# Patient Record
Sex: Male | Born: 1980 | Race: Black or African American | Hispanic: No | Marital: Married | Smoking: Never smoker
Health system: Southern US, Community
[De-identification: ages and names within clinical notes are randomized; demographics above are authoritative.]

---

## 2011-07-23 ENCOUNTER — Encounter: Payer: Self-pay | Admitting: Internal Medicine

## 2011-07-23 ENCOUNTER — Other Ambulatory Visit (INDEPENDENT_AMBULATORY_CARE_PROVIDER_SITE_OTHER): Payer: BC Managed Care – PPO

## 2011-07-23 ENCOUNTER — Ambulatory Visit (INDEPENDENT_AMBULATORY_CARE_PROVIDER_SITE_OTHER): Payer: BC Managed Care – PPO | Admitting: Internal Medicine

## 2011-07-23 VITALS — BP 118/70 | HR 75 | Temp 98.8°F | Resp 16 | Wt 185.0 lb

## 2011-07-23 DIAGNOSIS — Z0000 Encounter for general adult medical examination without abnormal findings: Secondary | ICD-10-CM

## 2011-07-23 DIAGNOSIS — Z23 Encounter for immunization: Secondary | ICD-10-CM

## 2011-07-23 LAB — LIPID PANEL
Cholesterol: 152 mg/dL (ref 0–200)
LDL Cholesterol: 95 mg/dL (ref 0–99)
Total CHOL/HDL Ratio: 3
Triglycerides: 25 mg/dL (ref 0.0–149.0)
VLDL: 5 mg/dL (ref 0.0–40.0)

## 2011-07-23 LAB — COMPREHENSIVE METABOLIC PANEL
ALT: 18 U/L (ref 0–53)
AST: 20 U/L (ref 0–37)
Albumin: 4.3 g/dL (ref 3.5–5.2)
CO2: 31 mEq/L (ref 19–32)
Calcium: 9 mg/dL (ref 8.4–10.5)
Chloride: 103 mEq/L (ref 96–112)
Potassium: 3.9 mEq/L (ref 3.5–5.1)
Sodium: 141 mEq/L (ref 135–145)
Total Protein: 7.6 g/dL (ref 6.0–8.3)

## 2011-07-23 LAB — CBC WITH DIFFERENTIAL/PLATELET
Basophils Absolute: 0 10*3/uL (ref 0.0–0.1)
Eosinophils Absolute: 0.1 10*3/uL (ref 0.0–0.7)
Lymphocytes Relative: 36.9 % (ref 12.0–46.0)
MCHC: 32.5 g/dL (ref 30.0–36.0)
Monocytes Relative: 7.9 % (ref 3.0–12.0)
Neutrophils Relative %: 53 % (ref 43.0–77.0)
RDW: 13.3 % (ref 11.5–14.6)

## 2011-07-23 LAB — URINALYSIS, ROUTINE W REFLEX MICROSCOPIC
Hgb urine dipstick: NEGATIVE
Ketones, ur: NEGATIVE
Total Protein, Urine: NEGATIVE
Urine Glucose: NEGATIVE

## 2011-07-23 NOTE — Patient Instructions (Signed)
Health Maintenance in Males MAINTAIN REGULAR HEALTH EXAMS  Maintain a healthy diet and normal weight. Increased weight leads to problems with blood pressure and diabetes. Decrease fat in the diet and increase exercise. Obtain a proper diet from your caregiver if necessary.   High blood pressure causes heart and blood vessel problems. Check blood pressures regularly and keep your blood pressure at normal limits. Aerobic exercise helps this. Persistent elevations of blood pressure should be treated with medications if weight loss and exercise are ineffective.   Avoid smoking, drinking in excess (more than 2 drinks per day), or use of street drugs. Do not share needles with anyone. Ask for help if you need assistance or instructions on stopping the use of alcohol, cigarettes, or drugs.   Maintain normal blood lipids and cholesterol. Your caregiver can give you information to lower your risk of heart disease or stroke.   Ask your caregiver if you are in need of early heart disease screening because of a strong family history of heart disease or signs of elevated testosterone (male sex hormone) levels. These can predispose you to early heart disease.   Practice safe sex. Practicing safe sex decreases your risk for a sexually transmitted infection (STI). Some of the STIs are gonorrhea, chlamydia, syphilis, trichimonas, herpes, human papillomavirus (HPV), and human immunodeficiency virus (HIV). Herpes, HIV, and HPV are viral illnesses that have no cure. These can result in disability, cancer, and death.   It is not safe for someone who has AIDS or is HIV positive to have unprotected sex with a partner who is HIV positive. The reason for this is the fact that there are many different strains of HIV. If you have a strain that is readily treated with medications and then suddenly introduce a strain from a partner that has no further treatment options, you may suddenly have a strain of HIV that is untreatable.  Even if you are both positive for HIV, it is still necessary to practice safe sex.   Use sunscreen with a SPF of 15 or greater. Being outside in the sun when your shadow caused by the sun is shorter than you are, means you are being exposed to sun at greater intensity. Lighter skinned people are at a greater risk of skin cancer.   Keep carbon monoxide and smoke detectors in your home and functioning at all times. Change the batteries every 6 months.   Do monthly examinations of your testicles. The best time to do this is after a hot shower or bath when the tissues are loose. Notify your caregivers of any lumps, tenderness, or changes in size or shape.   Notify your caregiver of new moles or changes in moles, especially if there is a change in shape or color. Also notify your caregiver if a mole is larger than the size of a pencil eraser.   Stay current with your tetanus shots and other required immunizations.  The Body Mass Index (BMI) is a way of measuring how much of your body is fat. Having a BMI above 27 increases the risk of heart disease, diabetes, hypertension, stroke, and other problems related to obesity. Document Released: 04/11/2008 Document Re-Released: 04/03/2010 [employer] Patient Information 2011 [employer]. 

## 2011-07-24 ENCOUNTER — Encounter: Payer: Self-pay | Admitting: Internal Medicine

## 2011-07-24 DIAGNOSIS — Z0000 Encounter for general adult medical examination without abnormal findings: Secondary | ICD-10-CM | POA: Insufficient documentation

## 2011-07-24 DIAGNOSIS — Z23 Encounter for immunization: Secondary | ICD-10-CM | POA: Insufficient documentation

## 2011-07-24 NOTE — Progress Notes (Signed)
  Subjective:    Patient ID: Stephen Nichols, male    DOB: 1981-05-15, 30 y.o.   MRN: [id number]  HPI New to me for a complete physical, he feels well and offers no complaints.   Review of Systems  Constitutional: Negative.   HENT: Negative.   Eyes: Negative.   Respiratory: Negative.   Cardiovascular: Negative.   Gastrointestinal: Negative.   Genitourinary: Negative.   Musculoskeletal: Negative.   Skin: Negative.   Neurological: Negative.   Hematological: Negative.   Psychiatric/Behavioral: Negative.        Objective:   Physical Exam  Vitals reviewed. Constitutional: He is oriented to person, place, and time. He appears well-developed and well-nourished. No distress.  HENT:  Mouth/Throat: Oropharynx is clear and moist. No oropharyngeal exudate.  Eyes: Conjunctivae are normal. Right eye exhibits no discharge. Left eye exhibits no discharge. No scleral icterus.  Neck: Normal range of motion. Neck supple. No JVD present. No tracheal deviation present. No thyromegaly present.  Cardiovascular: Normal rate, regular rhythm, normal heart sounds and intact distal pulses.  Exam reveals no gallop and no friction rub.   No murmur heard. Pulmonary/Chest: Effort normal and breath sounds normal. No stridor. No respiratory distress. He has no wheezes. He has no rales. He exhibits no tenderness.  Abdominal: Soft. Bowel sounds are normal. He exhibits no distension and no mass. There is no tenderness. There is no rebound and no guarding. Hernia confirmed negative in the right inguinal area and confirmed negative in the left inguinal area.  Genitourinary: Testes normal and penis normal. Right testis shows no mass, no swelling and no tenderness. Right testis is descended. Left testis shows no mass, no swelling and no tenderness. Left testis is descended. Circumcised. No penile erythema or penile tenderness. No discharge found.  Musculoskeletal: Normal range of motion. He exhibits no edema and no  tenderness.  Lymphadenopathy:    He has no cervical adenopathy.       Right: No inguinal adenopathy present.       Left: No inguinal adenopathy present.  Neurological: He is alert and oriented to person, place, and time. He has normal reflexes. He displays normal reflexes. No cranial nerve deficit. He exhibits normal muscle tone. Coordination normal.  Skin: Skin is warm and dry. No rash noted. He is not diaphoretic. No erythema. No pallor.  Psychiatric: He has a normal mood and affect. His behavior is normal. Judgment and thought content normal.          Assessment & Plan:

## 2011-07-24 NOTE — Assessment & Plan Note (Signed)
Exam done, labs ordered, pt ed material was given 

## 2012-10-13 ENCOUNTER — Ambulatory Visit (INDEPENDENT_AMBULATORY_CARE_PROVIDER_SITE_OTHER): Payer: BC Managed Care – PPO | Admitting: Internal Medicine

## 2012-10-13 ENCOUNTER — Encounter: Payer: Self-pay | Admitting: Internal Medicine

## 2012-10-13 VITALS — BP 112/80 | HR 56 | Temp 98.2°F | Ht 69.0 in | Wt 179.1 lb

## 2012-10-13 DIAGNOSIS — B0089 Other herpesviral infection: Secondary | ICD-10-CM

## 2012-10-13 MED ORDER — TRIAMCINOLONE ACETONIDE 0.1 % EX OINT: TOPICAL_OINTMENT | Freq: Two times a day (BID) | CUTANEOUS | Status: DC | PRN

## 2012-10-13 MED ORDER — VALACYCLOVIR HCL 1 G PO TABS: 1000.0000 mg | ORAL_TABLET | Freq: Three times a day (TID) | ORAL | Status: DC

## 2012-10-13 NOTE — Progress Notes (Signed)
  Subjective:    Patient ID: Stephen Nichols, male    DOB: 01-Jan-1981, 31 y.o.   MRN: [id number]  HPI  complains of skin issues, recurrent Vesicle cluster - currently on R posterior neck  Review of Systems  Constitutional: Negative for fever and fatigue.  Hematological: Negative for adenopathy. Does not bruise/bleed easily.       Objective:   Physical Exam BP 112/80  Pulse 56  Temp 98.2 F (36.8 C) (Oral)  Ht 5\' 9"  (1.753 m)  Wt 179 lb 1.9 oz (81.248 kg)  BMI 26.45 kg/m2  SpO2 96% Wt Readings from Last 3 Encounters:  10/13/12 179 lb 1.9 oz (81.248 kg)  07/23/11 185 lb (83.915 kg)   Constitutional:  He appears well-developed and well-nourished. No distress. nontoxic Neck: Normal range of motion. Neck supple. No JVD present. No thyromegaly present.  Cardiovascular: Normal rate, regular rhythm and normal heart sounds.  No murmur heard. no BLE edema Pulmonary/Chest: Effort normal and breath sounds normal. No respiratory distress. no wheezes.  Neurological: he is alert and oriented to person, place, and time. No cranial nerve deficit. Coordination normal.  Skin: herpetic cluster of vesicles on r posterior neck - no cellulitis - no confluent erythema - no ulceration or induration - remaining skin unaffected.  Psychiatric: he has a normal mood and affect. behavior is normal. Judgment and thought content normal.   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

## 2012-10-13 NOTE — Patient Instructions (Signed)
It was good to see you today. Valtrex x 1 week and steroid cream as needed for itch -  Your prescription(s) have been submitted to your pharmacy. Please take as directed and contact our office if you believe you are having problem(s) with the medication(s). Call if recurrent problems for biopsy here with Dr Yetta Barre or refer to dermatology as needed

## 2012-10-27 ENCOUNTER — Other Ambulatory Visit: Payer: Self-pay | Admitting: Internal Medicine

## 2012-11-16 ENCOUNTER — Encounter: Payer: Self-pay | Admitting: Internal Medicine

## 2012-11-16 ENCOUNTER — Ambulatory Visit (INDEPENDENT_AMBULATORY_CARE_PROVIDER_SITE_OTHER): Payer: BC Managed Care – PPO | Admitting: Internal Medicine

## 2012-11-16 VITALS — BP 118/66 | HR 64 | Temp 98.1°F | Resp 16 | Wt 182.2 lb

## 2012-11-16 DIAGNOSIS — D489 Neoplasm of uncertain behavior, unspecified: Secondary | ICD-10-CM

## 2012-11-16 DIAGNOSIS — Z23 Encounter for immunization: Secondary | ICD-10-CM

## 2012-11-16 DIAGNOSIS — L309 Dermatitis, unspecified: Secondary | ICD-10-CM

## 2012-11-16 DIAGNOSIS — L259 Unspecified contact dermatitis, unspecified cause: Secondary | ICD-10-CM

## 2012-11-16 MED ORDER — METHYLPREDNISOLONE ACETATE 80 MG/ML IJ SUSP
120.0000 mg | Freq: Once | INTRAMUSCULAR | Status: AC
  Administered 2012-11-16: 120 mg via INTRAMUSCULAR

## 2012-11-16 NOTE — Patient Instructions (Signed)

## 2012-11-16 NOTE — Progress Notes (Signed)
  Subjective:    Patient ID: Stephen Nichols, male    DOB: 10-15-1981, 32 y.o.   MRN: [id number]  Rash This is a chronic problem. The current episode started more than 1 year ago. The problem has been gradually worsening since onset. The affected locations include the neck. The rash is characterized by dryness and itchiness. He was exposed to nothing. Pertinent negatives include no anorexia, congestion, cough, diarrhea, eye pain, facial edema, fatigue, fever, joint pain, nail changes, rhinorrhea, shortness of breath, sore throat or vomiting. Treatments tried: zovirax and TAC cream. The treatment provided no relief. His past medical history is significant for allergies and eczema.      Review of Systems  Constitutional: Negative.  Negative for fever and fatigue.  HENT: Negative.  Negative for congestion, sore throat and rhinorrhea.   Eyes: Negative.  Negative for pain.  Respiratory: Negative.  Negative for cough and shortness of breath.   Cardiovascular: Negative.   Gastrointestinal: Negative.  Negative for vomiting, diarrhea and anorexia.  Genitourinary: Negative.   Musculoskeletal: Negative.  Negative for joint pain.  Skin: Positive for rash. Negative for nail changes.  Neurological: Negative.   Hematological: Negative.  Negative for adenopathy. Does not bruise/bleed easily.  Psychiatric/Behavioral: Negative.        Objective:   Physical Exam  Skin:          Over the posterior aspect of his neck there are 4 macules with hyperpigmentation, dense scale, xerosis, lichenification.  The largest one is biopsied.  The area was cleaned with betadine then prepped and draped in sterile fashion. Local anesthesia was obtained with 2% lido with epi. A 4 mm punch incision was made and the specimen was collected and sent. Then a suture was placed to close the would - 1 simple interrupted suture using 6-0 nylon. The closure effect was good and he tolerated it well. Triple antibiotic ointment and a  dressing were applied.      Lab Results  Component Value Date   WBC 7.4 07/23/2011   HGB 15.3 07/23/2011   HCT 47.1 07/23/2011   PLT 179.0 07/23/2011   GLUCOSE 79 07/23/2011   CHOL 152 07/23/2011   TRIG 25.0 07/23/2011   HDL 52.10 07/23/2011   LDLCALC 95 07/23/2011   ALT 18 07/23/2011   AST 20 07/23/2011   NA 141 07/23/2011   K 3.9 07/23/2011   CL 103 07/23/2011   CREATININE 1.1 07/23/2011   BUN 12 07/23/2011   CO2 31 07/23/2011   TSH 1.01 07/23/2011      Assessment & Plan:

## 2012-11-16 NOTE — Assessment & Plan Note (Signed)
Biopsy sent

## 2012-11-16 NOTE — Assessment & Plan Note (Signed)
I will check a biopsy specimen to help diagnose his condition He was given an injection of depo-medrol IM to help with symptom relief He will continue using TAC cream to the AA

## 2012-11-24 ENCOUNTER — Encounter: Payer: Self-pay | Admitting: Internal Medicine

## 2012-11-24 ENCOUNTER — Ambulatory Visit (INDEPENDENT_AMBULATORY_CARE_PROVIDER_SITE_OTHER): Payer: BC Managed Care – PPO | Admitting: Internal Medicine

## 2012-11-24 VITALS — BP 108/68 | HR 63 | Temp 98.4°F | Resp 16 | Wt 180.0 lb

## 2012-11-24 DIAGNOSIS — L259 Unspecified contact dermatitis, unspecified cause: Secondary | ICD-10-CM

## 2012-11-24 DIAGNOSIS — L309 Dermatitis, unspecified: Secondary | ICD-10-CM

## 2012-11-24 NOTE — Assessment & Plan Note (Signed)
He will continue with aggressive moisturization and will continue topical steroids, also was advised to try zyrtec as needed for itching

## 2012-11-24 NOTE — Patient Instructions (Signed)

## 2012-11-24 NOTE — Progress Notes (Signed)
  Subjective:    Patient ID: Stephen Nichols, male    DOB: 01/09/1981, 32 y.o.   MRN: [id number]  Rash This is a recurrent problem. The current episode started more than 1 month ago. The problem has been gradually improving since onset. The affected locations include the neck. The rash is characterized by dryness and itchiness. Pertinent negatives include no anorexia, congestion, cough, diarrhea, eye pain, facial edema, fatigue, fever, joint pain, nail changes, rhinorrhea, shortness of breath, sore throat or vomiting. Past treatments include topical steroids and oral steroids. The treatment provided moderate relief. His past medical history is significant for eczema.      Review of Systems  Constitutional: Negative.  Negative for fever and fatigue.  HENT: Negative.  Negative for congestion, sore throat and rhinorrhea.   Eyes: Negative.  Negative for pain.  Respiratory: Negative.  Negative for cough and shortness of breath.   Cardiovascular: Negative.   Gastrointestinal: Negative.  Negative for vomiting, diarrhea and anorexia.  Genitourinary: Negative.   Musculoskeletal: Negative.  Negative for joint pain.  Skin: Positive for rash. Negative for nail changes.  Neurological: Negative.   Hematological: Negative.   Psychiatric/Behavioral: Negative.        Objective:   Physical Exam  Vitals reviewed. Constitutional: He is oriented to person, place, and time. He appears well-developed and well-nourished. No distress.  HENT:  Head: Normocephalic and atraumatic.  Mouth/Throat: Oropharynx is clear and moist. No oropharyngeal exudate.  Eyes: Conjunctivae normal are normal. Right eye exhibits no discharge. Left eye exhibits no discharge. No scleral icterus.  Neck: Normal range of motion. Neck supple. No JVD present. No tracheal deviation present. No thyromegaly present.  Cardiovascular: Normal rate, regular rhythm, normal heart sounds and intact distal pulses.  Exam reveals no gallop and no  friction rub.   No murmur heard. Pulmonary/Chest: Effort normal and breath sounds normal. No stridor. No respiratory distress. He has no wheezes. He has no rales. He exhibits no tenderness.  Abdominal: Soft. Bowel sounds are normal. He exhibits no distension and no mass. There is no tenderness. There is no rebound and no guarding.  Musculoskeletal: Normal range of motion. He exhibits no edema and no tenderness.  Lymphadenopathy:    He has no cervical adenopathy.  Neurological: He is oriented to person, place, and time.  Skin: Skin is warm, dry and intact. Lesion and rash noted. No abrasion, no bruising, no burn, no ecchymosis, no laceration, no petechiae and no purpura noted. Rash is macular. Rash is not papular, not maculopapular, not nodular, not pustular, not vesicular and not urticarial. He is not diaphoretic. No erythema. No pallor.       Suture removed from posterior neck. The biopsy sight looks real good with no complications.  The areas of hyperpigmentation and scaling look much better.  Psychiatric: He has a normal mood and affect. His behavior is normal. Judgment and thought content normal.          Assessment & Plan:

## 2017-01-23 DIAGNOSIS — M62838 Other muscle spasm: Secondary | ICD-10-CM | POA: Diagnosis not present

## 2017-01-23 DIAGNOSIS — M5412 Radiculopathy, cervical region: Secondary | ICD-10-CM | POA: Diagnosis not present

## 2017-05-31 DIAGNOSIS — H40033 Anatomical narrow angle, bilateral: Secondary | ICD-10-CM | POA: Diagnosis not present

## 2017-05-31 DIAGNOSIS — H00024 Hordeolum internum left upper eyelid: Secondary | ICD-10-CM | POA: Diagnosis not present

## 2017-07-04 DIAGNOSIS — H00024 Hordeolum internum left upper eyelid: Secondary | ICD-10-CM | POA: Diagnosis not present

## 2017-07-08 ENCOUNTER — Ambulatory Visit (INDEPENDENT_AMBULATORY_CARE_PROVIDER_SITE_OTHER): Payer: BLUE CROSS/BLUE SHIELD

## 2017-07-08 DIAGNOSIS — Z23 Encounter for immunization: Secondary | ICD-10-CM | POA: Diagnosis not present

## 2017-10-13 ENCOUNTER — Encounter: Payer: Self-pay | Admitting: Internal Medicine

## 2017-11-19 ENCOUNTER — Encounter: Payer: Self-pay | Admitting: Internal Medicine

## 2018-05-21 ENCOUNTER — Telehealth: Payer: Self-pay | Admitting: Internal Medicine

## 2018-05-21 NOTE — Telephone Encounter (Signed)
Dr Ronnald Ramp, would you be willing to see this patient to re-establish care? His wife's appointment is on 06/11/2018 at 8:00am and their are openings if you wish to see him.

## 2018-05-21 NOTE — Telephone Encounter (Signed)
Called patient to inform. No answer and voicemail box was full.  I scheduled an appointment for him to come in on the same day as his wife (06/11/2018). Her appointment is at 8:00am and his is at 8:30am. They can go back together and be seen at the same time if they wish.

## 2018-05-21 NOTE — Telephone Encounter (Signed)
yes

## 2018-05-21 NOTE — Telephone Encounter (Signed)
Copied from [place] [id number]. Topic: Appointment Scheduling - Scheduling Inquiry for Clinic >> May 21, 2018 10:26 AM Bea Graff, NT wrote: Reason for CRM: Pt would like to see if Dr. Ronnald Ramp will accept him to continue as his PCP? Pt has not been seen since 2014. He would like to see if he can be seen the same day as his wife on 06/12/18. Please advise.

## 2018-05-30 DIAGNOSIS — H40033 Anatomical narrow angle, bilateral: Secondary | ICD-10-CM | POA: Diagnosis not present

## 2018-05-30 DIAGNOSIS — H04123 Dry eye syndrome of bilateral lacrimal glands: Secondary | ICD-10-CM | POA: Diagnosis not present

## 2018-06-11 ENCOUNTER — Other Ambulatory Visit (INDEPENDENT_AMBULATORY_CARE_PROVIDER_SITE_OTHER): Payer: BLUE CROSS/BLUE SHIELD

## 2018-06-11 ENCOUNTER — Ambulatory Visit (INDEPENDENT_AMBULATORY_CARE_PROVIDER_SITE_OTHER)
Admission: RE | Admit: 2018-06-11 | Discharge: 2018-06-11 | Disposition: A | Discharge status: Home / Self Care | Payer: BLUE CROSS/BLUE SHIELD | Source: Ambulatory Visit | Attending: Internal Medicine | Admitting: Internal Medicine

## 2018-06-11 ENCOUNTER — Ambulatory Visit: Payer: BLUE CROSS/BLUE SHIELD | Admitting: Internal Medicine

## 2018-06-11 ENCOUNTER — Encounter: Payer: Self-pay | Admitting: Internal Medicine

## 2018-06-11 VITALS — BP 132/64 | HR 64 | Temp 98.2°F | Ht 69.0 in | Wt 183.5 lb

## 2018-06-11 DIAGNOSIS — Z0001 Encounter for general adult medical examination with abnormal findings: Secondary | ICD-10-CM | POA: Diagnosis not present

## 2018-06-11 DIAGNOSIS — M150 Primary generalized (osteo)arthritis: Secondary | ICD-10-CM | POA: Diagnosis not present

## 2018-06-11 DIAGNOSIS — G8929 Other chronic pain: Secondary | ICD-10-CM | POA: Insufficient documentation

## 2018-06-11 DIAGNOSIS — M25562 Pain in left knee: Secondary | ICD-10-CM

## 2018-06-11 DIAGNOSIS — Z0000 Encounter for general adult medical examination without abnormal findings: Secondary | ICD-10-CM

## 2018-06-11 DIAGNOSIS — M159 Polyosteoarthritis, unspecified: Secondary | ICD-10-CM | POA: Insufficient documentation

## 2018-06-11 DIAGNOSIS — M25571 Pain in right ankle and joints of right foot: Secondary | ICD-10-CM

## 2018-06-11 LAB — COMPREHENSIVE METABOLIC PANEL
ALBUMIN: 4.5 g/dL (ref 3.5–5.2)
ALT: 14 U/L (ref 0–53)
AST: 15 U/L (ref 0–37)
Alkaline Phosphatase: 64 U/L (ref 39–117)
BILIRUBIN TOTAL: 0.9 mg/dL (ref 0.2–1.2)
BUN: 12 mg/dL (ref 6–23)
CALCIUM: 9.8 mg/dL (ref 8.4–10.5)
CHLORIDE: 103 meq/L (ref 96–112)
CO2: 31 meq/L (ref 19–32)
CREATININE: 1.18 mg/dL (ref 0.40–1.50)
GFR: 89.29 mL/min (ref >60.00)
Glucose, Bld: 98 mg/dL (ref 70–99)
Potassium: 4.3 mEq/L (ref 3.5–5.1)
Sodium: 140 mEq/L (ref 135–145)
Total Protein: 7.4 g/dL (ref 6.0–8.3)

## 2018-06-11 LAB — LIPID PANEL
CHOLESTEROL: 135 mg/dL (ref 0–200)
HDL: 49 mg/dL (ref >39.00)
LDL CALC: 76 mg/dL (ref 0–99)
NONHDL: 85.77
Total CHOL/HDL Ratio: 3
Triglycerides: 49 mg/dL (ref 0.0–149.0)
VLDL: 9.8 mg/dL (ref 0.0–40.0)

## 2018-06-11 LAB — CBC WITH DIFFERENTIAL/PLATELET
Basophils Absolute: 0.1 10*3/uL (ref 0.0–0.1)
Basophils Relative: 1.1 % (ref 0.0–3.0)
EOS ABS: 0.1 10*3/uL (ref 0.0–0.7)
EOS PCT: 1.5 % (ref 0.0–5.0)
HCT: 43.9 % (ref 39.0–52.0)
HEMOGLOBIN: 15.1 g/dL (ref 13.0–17.0)
Lymphocytes Relative: 25.2 % (ref 12.0–46.0)
Lymphs Abs: 1.8 10*3/uL (ref 0.7–4.0)
MCHC: 34.4 g/dL (ref 30.0–36.0)
MCV: 77.2 fl — ABNORMAL LOW (ref 78.0–100.0)
MONO ABS: 0.5 10*3/uL (ref 0.1–1.0)
Monocytes Relative: 7.2 % (ref 3.0–12.0)
Neutro Abs: 4.8 10*3/uL (ref 1.4–7.7)
Neutrophils Relative %: 65 % (ref 43.0–77.0)
Platelets: 198 10*3/uL (ref 150.0–400.0)
RBC: 5.69 Mil/uL (ref 4.22–5.81)
RDW: 13.4 % (ref 11.5–15.5)
WBC: 7.3 10*3/uL (ref 4.0–10.5)

## 2018-06-11 LAB — SEDIMENTATION RATE: Sed Rate: 3 mm/hr (ref 0–15)

## 2018-06-11 MED ORDER — DICLOFENAC 35 MG PO CAPS: 1.0000 | ORAL_CAPSULE | Freq: Three times a day (TID) | ORAL | 3 refills | Status: DC | PRN

## 2018-06-11 NOTE — Patient Instructions (Signed)

## 2018-06-11 NOTE — Progress Notes (Signed)
Subjective:  Patient ID: Stephen Nichols, male    DOB: 18-Aug-1981  Age: 37 y.o. MRN: [id number]  CC: Osteoarthritis and Annual Exam   HPI Stephen Nichols presents for f/up - He complains of joint pains.  He fractured his right ankle about 15 years ago and was treated conservatively.  Since then he has had intermittent episodes of pain in his ankle with activity.  Over the last year he is also developed intermittent discomfort in his left knee.  Additionally, sometimes his shoulders bother him.  He has not treated the symptoms.  He never notices any swelling or redness in his joints.  None of his small joints bother him.  He otherwise feels well and offers no other complaints today.  Outpatient Medications Prior to Visit  Medication Sig Dispense Refill  . Multiple Vitamin (MULTIVITAMIN) tablet Take 1 tablet by mouth once a week.    . triamcinolone ointment (KENALOG) 0.1 % Apply topically 2 (two) times daily as needed (for affected skin). 30 g 0  . valACYclovir (VALTREX) 1000 MG tablet Take 1 tablet (1,000 mg total) by mouth 3 (three) times daily. 21 tablet 0   No facility-administered medications prior to visit.     ROS Review of Systems  Constitutional: Negative for chills, diaphoresis, fatigue and fever.  HENT: Negative.   Eyes: Negative for visual disturbance.  Respiratory: Negative for cough, chest tightness, shortness of breath and wheezing.   Cardiovascular: Negative for chest pain, palpitations and leg swelling.  Gastrointestinal: Negative for abdominal pain, constipation, diarrhea and nausea.  Endocrine: Negative.   Genitourinary: Negative.  Negative for difficulty urinating, penile swelling, scrotal swelling, testicular pain and urgency.  Musculoskeletal: Positive for arthralgias. Negative for back pain and neck pain.  Skin: Negative.  Negative for color change, pallor and rash.  Neurological: Negative.  Negative for dizziness, weakness, light-headedness and numbness.    Hematological: Negative for adenopathy. Does not bruise/bleed easily.  Psychiatric/Behavioral: Negative.     Objective:  BP 132/64 (BP Location: Left Arm, Patient Position: Sitting, Cuff Size: Normal)   Pulse 64   Temp 98.2 F (36.8 C) (Oral)   Ht 5\' 9"  (1.753 m)   Wt 183 lb 8 oz (83.2 kg)   SpO2 95%   BMI 27.10 kg/m   BP Readings from Last 3 Encounters:  06/11/18 132/64  11/24/12 108/68  11/16/12 118/66    Wt Readings from Last 3 Encounters:  06/11/18 183 lb 8 oz (83.2 kg)  11/24/12 180 lb (81.6 kg)  11/16/12 182 lb 4 oz (82.7 kg)    Physical Exam  Constitutional: He is oriented to person, place, and time. No distress.  HENT:  Mouth/Throat: Oropharynx is clear and moist. No oropharyngeal exudate.  Eyes: Conjunctivae are normal.  Neck: Normal range of motion. Neck supple. No JVD present. No thyromegaly present.  Cardiovascular: Normal rate, regular rhythm and normal heart sounds.  Pulmonary/Chest: Effort normal and breath sounds normal. He has no wheezes. He has no rales.  Abdominal: Soft. Bowel sounds are normal. He exhibits no mass. There is no hepatosplenomegaly. There is no tenderness.  Musculoskeletal: Normal range of motion. He exhibits no edema, tenderness or deformity.       Right shoulder: Normal.       Left shoulder: Normal.       Left knee: Normal. He exhibits normal range of motion, no swelling, no effusion, no ecchymosis, no deformity, no erythema and no bony tenderness. No tenderness found.       Right ankle:  Normal. He exhibits normal range of motion, no swelling, no ecchymosis and no deformity. No tenderness. Achilles tendon normal.  Lymphadenopathy:    He has no cervical adenopathy.  Neurological: He is alert and oriented to person, place, and time.  Skin: Skin is warm and dry. No rash noted. He is not diaphoretic.  Vitals reviewed.   Lab Results  Component Value Date   WBC 7.4 07/23/2011   HGB 15.3 07/23/2011   HCT 47.1 07/23/2011   PLT 179.0  07/23/2011   GLUCOSE 79 07/23/2011   CHOL 152 07/23/2011   TRIG 25.0 07/23/2011   HDL 52.10 07/23/2011   LDLCALC 95 07/23/2011   ALT 18 07/23/2011   AST 20 07/23/2011   NA 141 07/23/2011   K 3.9 07/23/2011   CL 103 07/23/2011   CREATININE 1.1 07/23/2011   BUN 12 07/23/2011   CO2 31 07/23/2011   TSH 1.01 07/23/2011    Patient was never admitted.  Assessment & Plan:   Add was seen today for osteoarthritis and annual exam.  Diagnoses and all orders for this visit:  Chronic pain of right ankle- Exam, plain films, and lab work are negative for inflammatory arthritis or structural lesions.  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 -     DG Ankle Complete Right; Future  Chronic pain of left knee- As above -     DG Knee Complete 4 Views Left; Future  Routine general medical examination at a health care facility- Exam completed, labs reviewed, vaccines reviewed and updated, patient education material was given.  Primary osteoarthritis involving multiple joints -     Diclofenac (ZORVOLEX) 35 MG CAPS; Take 1 capsule by mouth 3 (three) times daily with meals as needed.   I have discontinued Danis Weissberg's multivitamin, valACYclovir, and triamcinolone ointment. I am also having him start on Diclofenac.  Meds ordered this encounter  Medications  . Diclofenac (ZORVOLEX) 35 MG CAPS    Sig: Take 1 capsule by mouth 3 (three) times daily with meals as needed.    Dispense:  90 capsule    Refill:  3     Follow-up: No follow-ups on file.  Scarlette Calico, MD

## 2018-09-10 ENCOUNTER — Ambulatory Visit: Payer: BLUE CROSS/BLUE SHIELD | Admitting: Internal Medicine

## 2018-10-27 ENCOUNTER — Ambulatory Visit: Payer: BLUE CROSS/BLUE SHIELD | Admitting: Internal Medicine

## 2019-06-23 IMAGING — DX DG KNEE COMPLETE 4+V*L*
4 series · 4 of 4 positions shown · non-contrast
Comparison: None.

CLINICAL DATA: Chronic LATERAL LEFT knee pain.  No known injuries.

EXAM:
LEFT KNEE - COMPLETE 4+ VIEW

[knee ap]
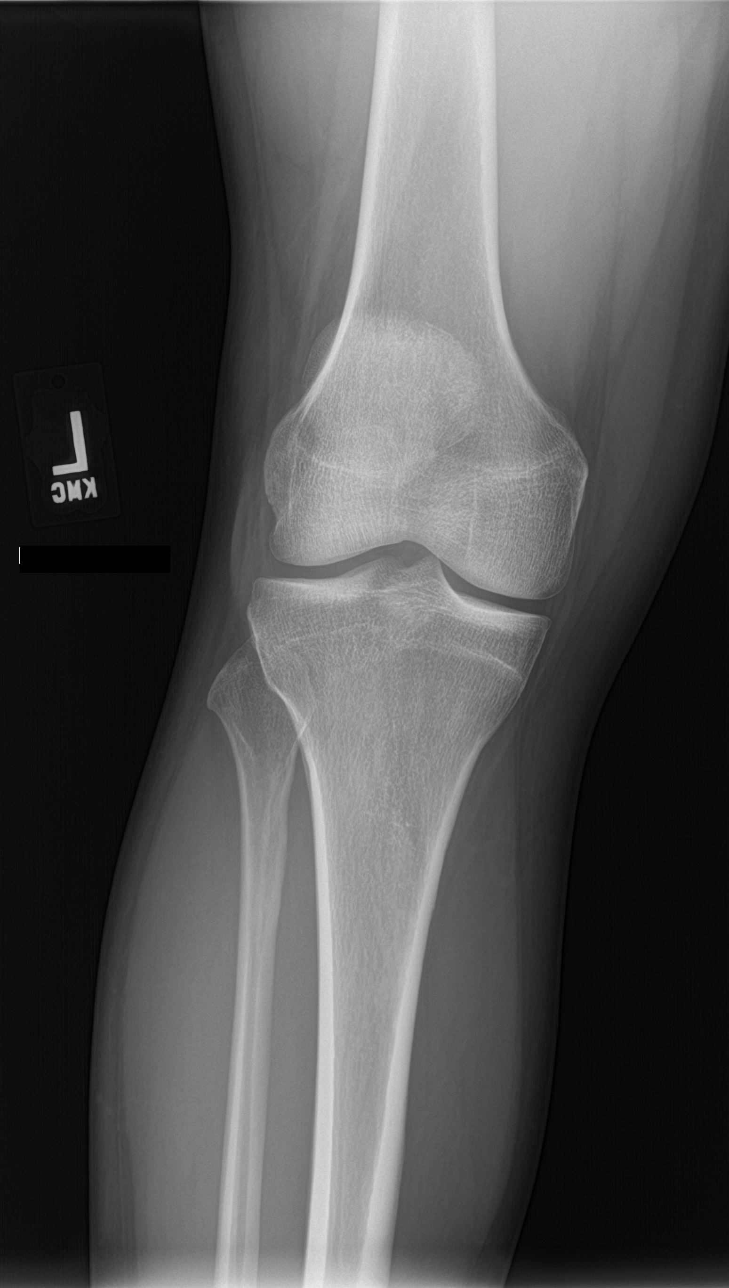

[knee tunnel]
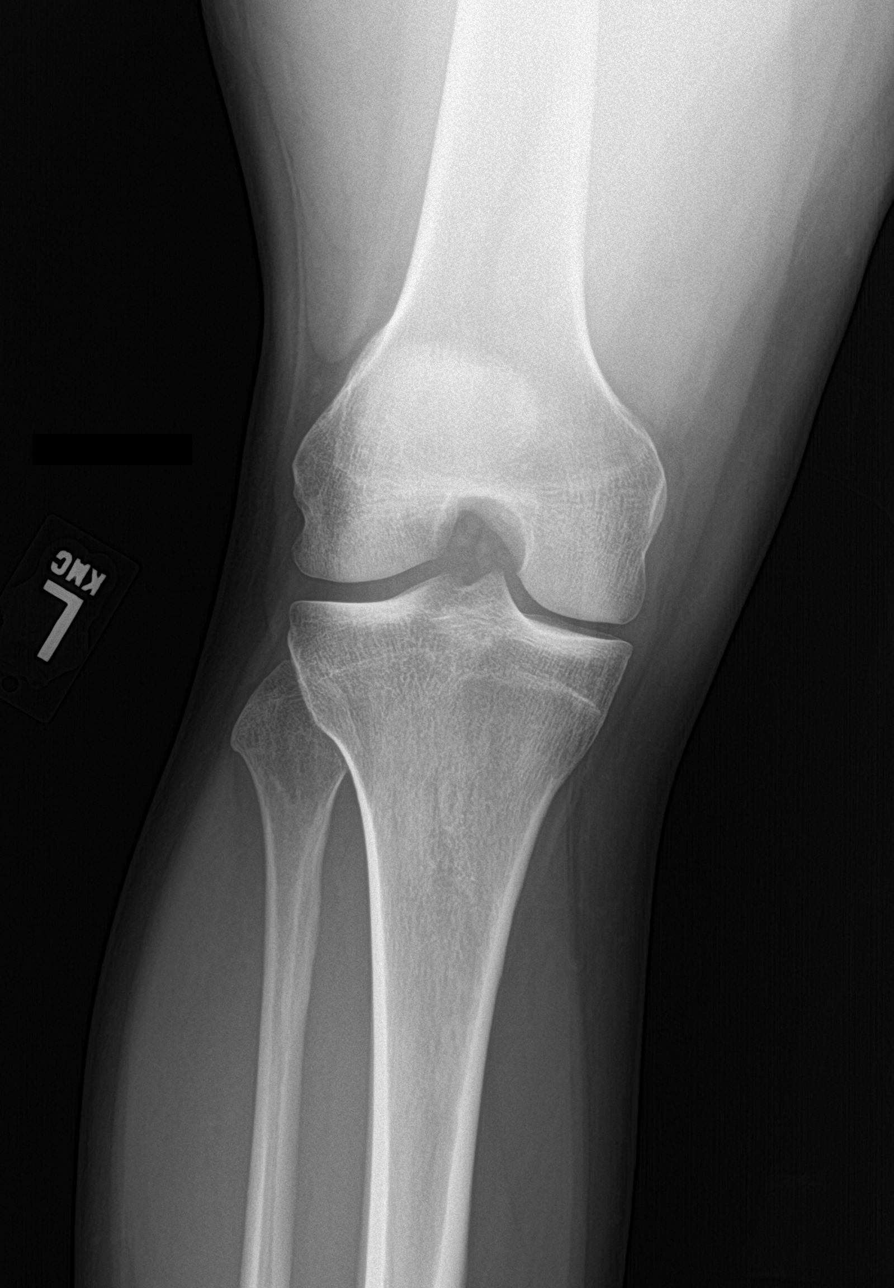

[knee lat]
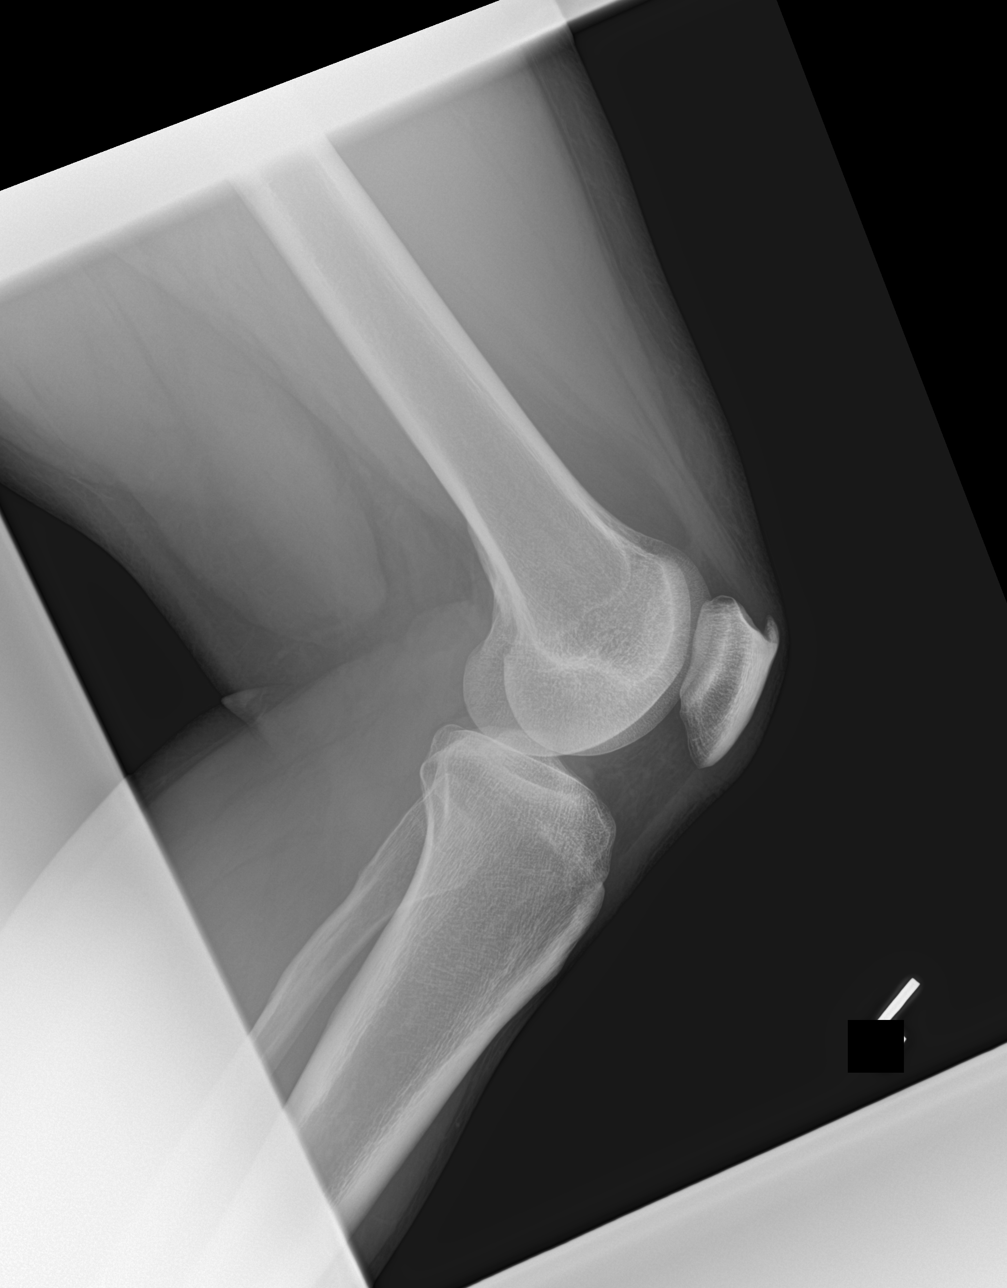

[sunrise]
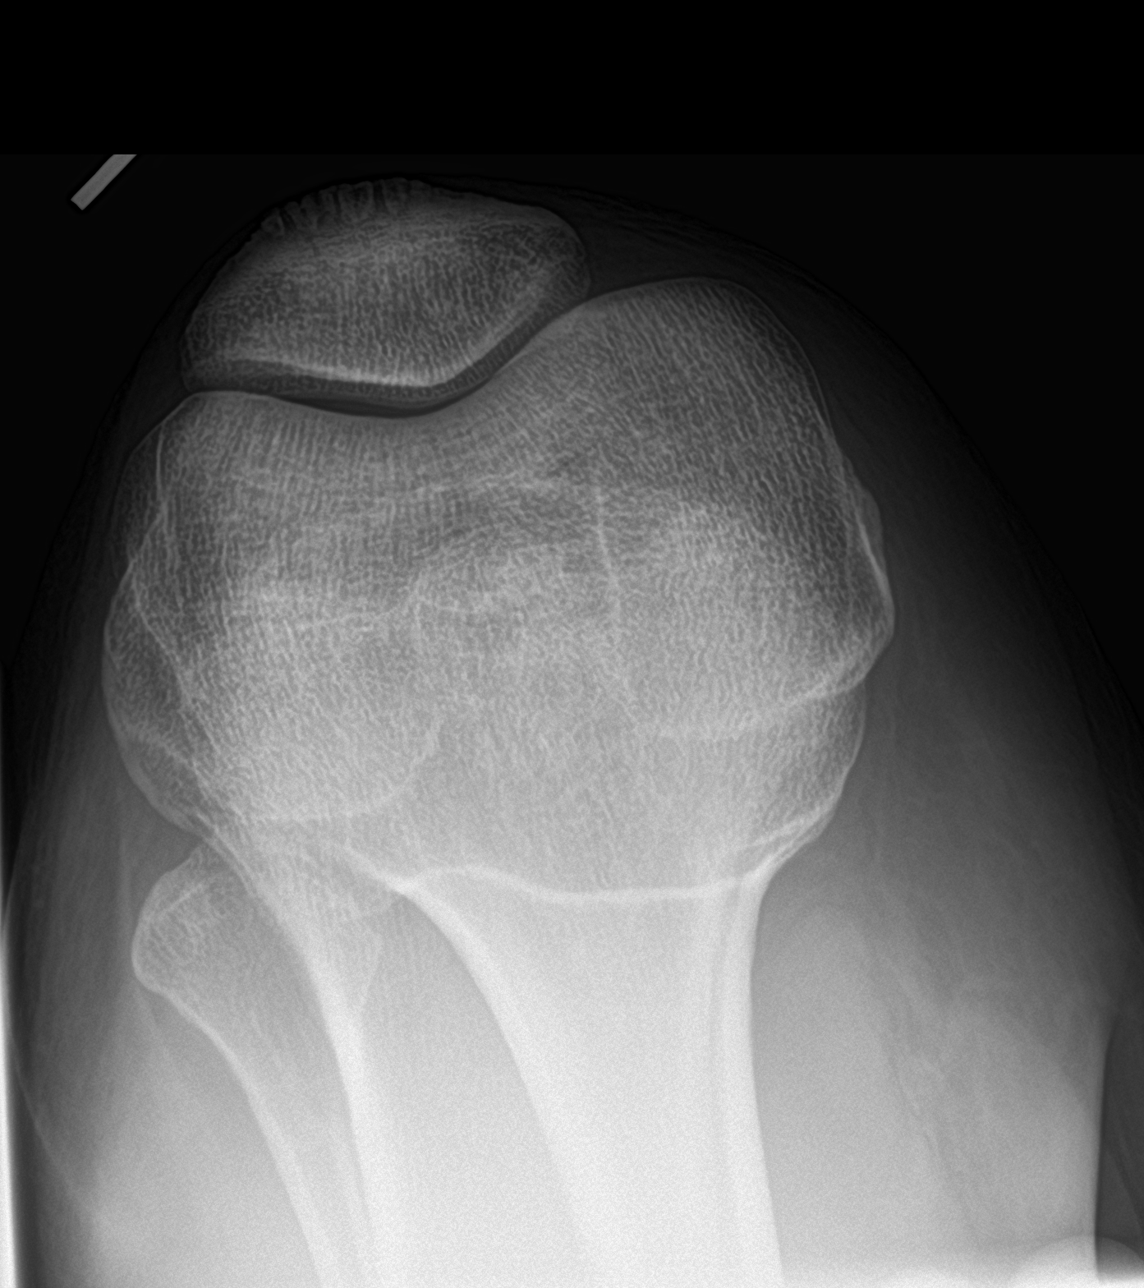

[4 of 4 positions shown; findings below may reference images not displayed]

FINDINGS: No evidence of acute, subacute or healed fractures. Well-preserved
joint spaces. Well-preserved bone mineral density. No intrinsic
osseous abnormalities. No evidence of a joint effusion. Small
enthesopathic spur at the insertion of the quadriceps tendon on the
SUPERIOR patella.
IMPRESSION: No significant osseous abnormality.

## 2020-03-02 ENCOUNTER — Ambulatory Visit (INDEPENDENT_AMBULATORY_CARE_PROVIDER_SITE_OTHER): Payer: BC Managed Care – PPO | Admitting: Internal Medicine

## 2020-03-02 ENCOUNTER — Other Ambulatory Visit: Payer: Self-pay

## 2020-03-02 ENCOUNTER — Encounter: Payer: Self-pay | Admitting: Internal Medicine

## 2020-03-02 VITALS — BP 110/64 | HR 64 | Temp 98.8°F | Resp 16 | Ht 71.0 in | Wt 199.1 lb

## 2020-03-02 DIAGNOSIS — N529 Male erectile dysfunction, unspecified: Secondary | ICD-10-CM | POA: Diagnosis not present

## 2020-03-02 DIAGNOSIS — Z0000 Encounter for general adult medical examination without abnormal findings: Secondary | ICD-10-CM

## 2020-03-02 LAB — LIPID PANEL
Cholesterol: 163 mg/dL (ref 0–200)
HDL: 44.9 mg/dL (ref >39.00)
LDL Cholesterol: 109 mg/dL — ABNORMAL HIGH (ref 0–99)
NonHDL: 118.58
Total CHOL/HDL Ratio: 4
Triglycerides: 49 mg/dL (ref 0.0–149.0)
VLDL: 9.8 mg/dL (ref 0.0–40.0)

## 2020-03-02 LAB — CBC WITH DIFFERENTIAL/PLATELET
Basophils Absolute: 0.1 10*3/uL (ref 0.0–0.1)
Basophils Relative: 1 % (ref 0.0–3.0)
Eosinophils Absolute: 0.1 10*3/uL (ref 0.0–0.7)
Eosinophils Relative: 1.7 % (ref 0.0–5.0)
HCT: 42.7 % (ref 39.0–52.0)
Hemoglobin: 14.6 g/dL (ref 13.0–17.0)
Lymphocytes Relative: 33 % (ref 12.0–46.0)
Lymphs Abs: 2.1 10*3/uL (ref 0.7–4.0)
MCHC: 34.2 g/dL (ref 30.0–36.0)
MCV: 76.3 fl — ABNORMAL LOW (ref 78.0–100.0)
Monocytes Absolute: 0.5 10*3/uL (ref 0.1–1.0)
Monocytes Relative: 8 % (ref 3.0–12.0)
Neutro Abs: 3.7 10*3/uL (ref 1.4–7.7)
Neutrophils Relative %: 56.3 % (ref 43.0–77.0)
Platelets: 200 10*3/uL (ref 150.0–400.0)
RBC: 5.6 Mil/uL (ref 4.22–5.81)
RDW: 13.7 % (ref 11.5–15.5)
WBC: 6.5 10*3/uL (ref 4.0–10.5)

## 2020-03-02 LAB — HEPATIC FUNCTION PANEL
ALT: 18 U/L (ref 0–53)
AST: 16 U/L (ref 0–37)
Albumin: 4.7 g/dL (ref 3.5–5.2)
Alkaline Phosphatase: 64 U/L (ref 39–117)
Bilirubin, Direct: 0.1 mg/dL (ref 0.0–0.3)
Total Bilirubin: 0.8 mg/dL (ref 0.2–1.2)
Total Protein: 7.3 g/dL (ref 6.0–8.3)

## 2020-03-02 LAB — BASIC METABOLIC PANEL
BUN: 17 mg/dL (ref 6–23)
CO2: 32 mEq/L (ref 19–32)
Calcium: 9.5 mg/dL (ref 8.4–10.5)
Chloride: 102 mEq/L (ref 96–112)
Creatinine, Ser: 1.15 mg/dL (ref 0.40–1.50)
GFR: 85.75 mL/min (ref >60.00)
Glucose, Bld: 90 mg/dL (ref 70–99)
Potassium: 4.2 mEq/L (ref 3.5–5.1)
Sodium: 138 mEq/L (ref 135–145)

## 2020-03-02 LAB — TSH: TSH: 1.37 u[IU]/mL (ref 0.35–4.50)

## 2020-03-02 LAB — PSA: PSA: 0.68 ng/mL (ref 0.10–4.00)

## 2020-03-02 MED ORDER — SILDENAFIL CITRATE 20 MG PO TABS: 80.0000 mg | ORAL_TABLET | Freq: Every day | ORAL | 5 refills | Status: DC | PRN

## 2020-03-02 NOTE — Patient Instructions (Signed)

## 2020-03-02 NOTE — Progress Notes (Signed)
Subjective:  Patient ID: Stephen Nichols, male    DOB: 1981/10/19  Age: 39 y.o. MRN: [id number]  CC: Annual Exam  This visit occurred during the SARS-CoV-2 public health emergency.  Safety protocols were in place, including screening questions prior to the visit, additional usage of staff PPE, and extensive cleaning of exam room while observing appropriate contact time as indicated for disinfecting solutions.    HPI Stephen Nichols presents for a CPX.  Since I last saw him he has developed concerns about erectile dysfunction.  He tells me his libido is good.  He has trouble getting and maintaining an erection and penetrating.  He is concerned about prostate cancer.    Outpatient Medications Prior to Visit  Medication Sig Dispense Refill  . Multiple Vitamin (MULTIVITAMIN) tablet Take 1 tablet by mouth daily.    . Omega-3 Fatty Acids (FISH OIL) 500 MG CAPS Take by mouth.    . Diclofenac (ZORVOLEX) 35 MG CAPS Take 1 capsule by mouth 3 (three) times daily with meals as needed. 90 capsule 3   No facility-administered medications prior to visit.    ROS Review of Systems  Constitutional: Positive for unexpected weight change (wt gain). Negative for diaphoresis and fatigue.  HENT: Negative.   Eyes: Negative.   Respiratory: Negative for cough, chest tightness, shortness of breath and wheezing.   Cardiovascular: Negative for chest pain, palpitations and leg swelling.  Gastrointestinal: Negative for abdominal pain, constipation, diarrhea, nausea and vomiting.  Endocrine: Negative.   Genitourinary: Negative.  Negative for difficulty urinating, dysuria, scrotal swelling, testicular pain and urgency.       ++ED  Musculoskeletal: Negative.   Skin: Negative.   Neurological: Negative for headaches.  Hematological: Negative.   Psychiatric/Behavioral: Negative.     Objective:  BP 110/64 (BP Location: Left Arm, Patient Position: Sitting, Cuff Size: Large)   Pulse 64   Temp 98.8 F (37.1 C)  (Oral)   Resp 16   Ht 5\' 11"  (1.803 m)   Wt 199 lb 2 oz (90.3 kg)   SpO2 97%   BMI 27.77 kg/m   BP Readings from Last 3 Encounters:  03/02/20 110/64  06/11/18 132/64  11/24/12 108/68    Wt Readings from Last 3 Encounters:  03/02/20 199 lb 2 oz (90.3 kg)  06/11/18 183 lb 8 oz (83.2 kg)  11/24/12 180 lb (81.6 kg)    Physical Exam Vitals reviewed.  Constitutional:      Appearance: Normal appearance.  HENT:     Nose: Nose normal.     Mouth/Throat:     Mouth: Mucous membranes are moist.  Eyes:     General: No scleral icterus.    Conjunctiva/sclera: Conjunctivae normal.  Cardiovascular:     Rate and Rhythm: Normal rate and regular rhythm.     Heart sounds: No murmur.  Pulmonary:     Effort: Pulmonary effort is normal.     Breath sounds: No stridor. No wheezing, rhonchi or rales.  Abdominal:     General: Abdomen is flat. Bowel sounds are normal. There is no distension.     Palpations: Abdomen is soft. There is no hepatomegaly, splenomegaly or mass.     Tenderness: There is no abdominal tenderness.  Genitourinary:    Pubic Area: No rash.      Penis: Normal and circumcised. No discharge, swelling or lesions.      Testes: Normal.        Right: Mass, tenderness or swelling not present.  Left: Tenderness or swelling not present.     Epididymis:     Right: Normal. Not inflamed or enlarged.     Left: Normal. Not inflamed or enlarged.     Prostate: Normal. Not enlarged, not tender and no nodules present.     Rectum: Normal. Guaiac result negative. No mass, tenderness, anal fissure, external hemorrhoid or internal hemorrhoid. Normal anal tone.  Musculoskeletal:        General: Normal range of motion.     Cervical back: Neck supple.     Right lower leg: No edema.  Lymphadenopathy:     Cervical: No cervical adenopathy.  Skin:    General: Skin is warm and dry.  Neurological:     General: No focal deficit present.     Mental Status: He is alert.  Psychiatric:         Mood and Affect: Mood normal.        Behavior: Behavior normal.     Lab Results  Component Value Date   WBC 6.5 03/02/2020   HGB 14.6 03/02/2020   HCT 42.7 03/02/2020   PLT 200.0 03/02/2020   GLUCOSE 90 03/02/2020   CHOL 163 03/02/2020   TRIG 49.0 03/02/2020   HDL 44.90 03/02/2020   LDLCALC 109 (H) 03/02/2020   ALT 18 03/02/2020   AST 16 03/02/2020   NA 138 03/02/2020   K 4.2 03/02/2020   CL 102 03/02/2020   CREATININE 1.15 03/02/2020   BUN 17 03/02/2020   CO2 32 03/02/2020   TSH 1.37 03/02/2020   PSA 0.68 03/02/2020    DG Ankle Complete Right  Result Date: 06/11/2018 CLINICAL DATA:  Chronic LATERAL RIGHT ankle pain. No known injuries. EXAM: RIGHT ANKLE - COMPLETE 3+ VIEW COMPARISON:  None. FINDINGS: No evidence of acute fracture. Ankle mortise intact with well-preserved joint space. Well-preserved bone mineral density. No intrinsic osseous abnormalities. No visible joint effusion. IMPRESSION: Normal examination. Electronically Signed   By: Evangeline Dakin M.D.   On: 06/11/2018 11:37   DG Knee Complete 4 Views Left  Result Date: 06/11/2018 CLINICAL DATA:  Chronic LATERAL LEFT knee pain.  No known injuries. EXAM: LEFT KNEE - COMPLETE 4+ VIEW COMPARISON:  None. FINDINGS: No evidence of acute, subacute or healed fractures. Well-preserved joint spaces. Well-preserved bone mineral density. No intrinsic osseous abnormalities. No evidence of a joint effusion. Small enthesopathic spur at the insertion of the quadriceps tendon on the SUPERIOR patella. IMPRESSION: No significant osseous abnormality. Electronically Signed   By: Evangeline Dakin M.D.   On: 06/11/2018 11:37    Assessment & Plan:   Gerasimos was seen today for annual exam.  Diagnoses and all orders for this visit:  Routine general medical examination at a health care facility- Exam completed, labs reviewed, vaccines reviewed, patient education was given. -     Lipid panel; Future -     HIV Antibody (routine testing w  rflx); Future -     HIV Antibody (routine testing w rflx) -     Lipid panel  Erectile dysfunction, unspecified erectile dysfunction type- Work-up for secondary causes is negative.  I recommended that he try a PDE 5 inhibitor. -     CBC with Differential/Platelet; Future -     Basic metabolic panel; Future -     TSH; Future -     Testosterone Total,Free,Bio, Males; Future -     Hepatic function panel; Future -     PSA; Future -     Prolactin; Future -  sildenafil (REVATIO) 20 MG tablet; Take 4 tablets (80 mg total) by mouth daily as needed. -     Prolactin -     PSA -     Hepatic function panel -     Testosterone Total,Free,Bio, Males -     TSH -     Basic metabolic panel -     CBC with Differential/Platelet   I have discontinued Breaker Skolnik's Diclofenac. I am also having him start on sildenafil. Additionally, I am having him maintain his multivitamin and Fish Oil.  Meds ordered this encounter  Medications  . sildenafil (REVATIO) 20 MG tablet    Sig: Take 4 tablets (80 mg total) by mouth daily as needed.    Dispense:  40 tablet    Refill:  5     Follow-up: Return in about 6 months (around 09/02/2020).  Scarlette Calico, MD

## 2020-03-03 ENCOUNTER — Encounter: Payer: Self-pay | Admitting: Internal Medicine

## 2020-03-03 LAB — TESTOSTERONE TOTAL,FREE,BIO, MALES
Albumin: 4.6 g/dL (ref 3.6–5.1)
Sex Hormone Binding: 17 nmol/L (ref 10–50)
Testosterone, Bioavailable: 187.9 ng/dL (ref >=110.0)
Testosterone, Free: 89.5 pg/mL (ref 46.0–224.0)
Testosterone: 420 ng/dL (ref 250–827)

## 2020-03-03 LAB — PROLACTIN: Prolactin: 13.3 ng/mL (ref 2.0–18.0)

## 2020-03-03 LAB — HIV ANTIBODY (ROUTINE TESTING W REFLEX): HIV 1&2 Ab, 4th Generation: NONREACTIVE

## 2021-06-05 ENCOUNTER — Ambulatory Visit (INDEPENDENT_AMBULATORY_CARE_PROVIDER_SITE_OTHER): Payer: BC Managed Care – PPO | Admitting: Internal Medicine

## 2021-06-05 ENCOUNTER — Encounter: Payer: Self-pay | Admitting: Internal Medicine

## 2021-06-05 ENCOUNTER — Other Ambulatory Visit: Payer: Self-pay

## 2021-06-05 VITALS — BP 114/72 | HR 77 | Temp 99.4°F | Ht 71.0 in | Wt 185.0 lb

## 2021-06-05 DIAGNOSIS — Z23 Encounter for immunization: Secondary | ICD-10-CM | POA: Diagnosis not present

## 2021-06-05 DIAGNOSIS — U071 COVID-19: Secondary | ICD-10-CM | POA: Diagnosis not present

## 2021-06-05 DIAGNOSIS — Z1159 Encounter for screening for other viral diseases: Secondary | ICD-10-CM | POA: Diagnosis not present

## 2021-06-05 DIAGNOSIS — J3089 Other allergic rhinitis: Secondary | ICD-10-CM | POA: Diagnosis not present

## 2021-06-05 DIAGNOSIS — Z0001 Encounter for general adult medical examination with abnormal findings: Secondary | ICD-10-CM | POA: Insufficient documentation

## 2021-06-05 DIAGNOSIS — J019 Acute sinusitis, unspecified: Secondary | ICD-10-CM

## 2021-06-05 LAB — LIPID PANEL
Cholesterol: 139 mg/dL (ref 0–200)
HDL: 50.5 mg/dL (ref >39.00)
LDL Cholesterol: 75 mg/dL (ref 0–99)
NonHDL: 88.88
Total CHOL/HDL Ratio: 3
Triglycerides: 69 mg/dL (ref 0.0–149.0)
VLDL: 13.8 mg/dL (ref 0.0–40.0)

## 2021-06-05 MED ORDER — METHYLPREDNISOLONE 4 MG PO TBPK: ORAL_TABLET | ORAL | 0 refills | Status: AC

## 2021-06-05 NOTE — Patient Instructions (Signed)
Health Maintenance, Male Adopting a healthy lifestyle and getting preventive care are important in promoting health and wellness. Ask your health care provider about: The right schedule for you to have regular tests and exams. Things you can do on your own to prevent diseases and keep yourself healthy. What should I know about diet, weight, and exercise? Eat a healthy diet  Eat a diet that includes plenty of vegetables, fruits, low-fat dairy products, and lean protein. Do not eat a lot of foods that are high in solid fats, added sugars, or sodium.  Maintain a healthy weight Body mass index (BMI) is a measurement that can be used to identify possible weight problems. It estimates body fat based on height and weight. Your health care provider can help determine your BMI and help you achieve or maintain ahealthy weight. Get regular exercise Get regular exercise. This is one of the most important things you can do for your health. Most adults should: Exercise for at least 150 minutes each week. The exercise should increase your heart rate and make you sweat (moderate-intensity exercise). Do strengthening exercises at least twice a week. This is in addition to the moderate-intensity exercise. Spend less time sitting. Even light physical activity can be beneficial. Watch cholesterol and blood lipids Have your blood tested for lipids and cholesterol at 40 years of age, then havethis test every 5 years. You may need to have your cholesterol levels checked more often if: Your lipid or cholesterol levels are high. You are older than 40 years of age. You are at high risk for heart disease. What should I know about cancer screening? Many types of cancers can be detected early and may often be prevented. Depending on your health history and family history, you may need to have cancer screening at various ages. This may include screening for: Colorectal cancer. Prostate cancer. Skin cancer. Lung  cancer. What should I know about heart disease, diabetes, and high blood pressure? Blood pressure and heart disease High blood pressure causes heart disease and increases the risk of stroke. This is more likely to develop in people who have high blood pressure readings, are of African descent, or are overweight. Talk with your health care provider about your target blood pressure readings. Have your blood pressure checked: Every 3-5 years if you are 18-39 years of age. Every year if you are 40 years old or older. If you are between the ages of 65 and 75 and are a current or former smoker, ask your health care provider if you should have a one-time screening for abdominal aortic aneurysm (AAA). Diabetes Have regular diabetes screenings. This checks your fasting blood sugar level. Have the screening done: Once every three years after age 45 if you are at a normal weight and have a low risk for diabetes. More often and at a younger age if you are overweight or have a high risk for diabetes. What should I know about preventing infection? Hepatitis B If you have a higher risk for hepatitis B, you should be screened for this virus. Talk with your health care provider to find out if you are at risk forhepatitis B infection. Hepatitis C Blood testing is recommended for: Everyone born from 1945 through 1965. Anyone with known risk factors for hepatitis C. Sexually transmitted infections (STIs) You should be screened each year for STIs, including gonorrhea and chlamydia, if: You are sexually active and are younger than 40 years of age. You are older than 40 years of age   and your health care provider tells you that you are at risk for this type of infection. Your sexual activity has changed since you were last screened, and you are at increased risk for chlamydia or gonorrhea. Ask your health care provider if you are at risk. Ask your health care provider about whether you are at high risk for HIV.  Your health care provider may recommend a prescription medicine to help prevent HIV infection. If you choose to take medicine to prevent HIV, you should first get tested for HIV. You should then be tested every 3 months for as long as you are taking the medicine. Follow these instructions at home: Lifestyle Do not use any products that contain nicotine or tobacco, such as cigarettes, e-cigarettes, and chewing tobacco. If you need help quitting, ask your health care provider. Do not use street drugs. Do not share needles. Ask your health care provider for help if you need support or information about quitting drugs. Alcohol use Do not drink alcohol if your health care provider tells you not to drink. If you drink alcohol: Limit how much you have to 0-2 drinks a day. Be aware of how much alcohol is in your drink. In the [place] one drink equals one 12 oz bottle of beer (355 mL), one 5 oz glass of wine (148 mL), or one 1 oz glass of hard liquor (44 mL). General instructions Schedule regular health, dental, and eye exams. Stay current with your vaccines. Tell your health care provider if: You often feel depressed. You have ever been abused or do not feel safe at home. Summary Adopting a healthy lifestyle and getting preventive care are important in promoting health and wellness. Follow your health care provider's instructions about healthy diet, exercising, and getting tested or screened for diseases. Follow your health care provider's instructions on monitoring your cholesterol and blood pressure. This information is not intended to replace advice given to you by your health care provider. Make sure you discuss any questions you have with your healthcare provider. Document Revised: 10/07/2018 Document Reviewed: 10/07/2018 Elsevier Patient Education  2022 [employer]  

## 2021-06-05 NOTE — Progress Notes (Signed)
Subjective:  Patient ID: Stephen Nichols, male    DOB: 04/05/1981  Age: 40 y.o. MRN: [id number]  CC: Annual Exam and Sinusitis  This visit occurred during the SARS-CoV-2 public health emergency.  Safety protocols were in place, including screening questions prior to the visit, additional usage of staff PPE, and extensive cleaning of exam room while observing appropriate contact time as indicated for disinfecting solutions.    HPI Stephen Nichols presents for a CPX and f/up -   He sprayed for ants in his garage about 3 days ago.  The spray that he used has triggered nasal congestion, runny nose, and postnasal drip.  He is gotten some symptom relief with Sudafed and Benadryl.  He has to test negative for COVID before he can go back to work.  Outpatient Medications Prior to Visit  Medication Sig Dispense Refill   Multiple Vitamin (MULTIVITAMIN) tablet Take 1 tablet by mouth daily.     Omega-3 Fatty Acids (FISH OIL) 500 MG CAPS Take by mouth.     sildenafil (REVATIO) 20 MG tablet Take 4 tablets (80 mg total) by mouth daily as needed. 40 tablet 5   No facility-administered medications prior to visit.    ROS Review of Systems  Constitutional:  Negative for chills, diaphoresis, fatigue and fever.  HENT:  Positive for congestion, postnasal drip and rhinorrhea. Negative for ear pain, sinus pressure, sinus pain, sore throat, tinnitus and voice change.   Eyes: Negative.   Respiratory: Negative.  Negative for cough, shortness of breath, wheezing and stridor.   Cardiovascular:  Negative for chest pain, palpitations and leg swelling.  Gastrointestinal:  Negative for abdominal pain, diarrhea, nausea and vomiting.  Endocrine: Negative.   Genitourinary: Negative.  Negative for difficulty urinating, scrotal swelling and testicular pain.  Musculoskeletal: Negative.  Negative for arthralgias and myalgias.  Skin: Negative.   Neurological: Negative.   Hematological:  Negative for adenopathy. Does not  bruise/bleed easily.   Objective:  BP 114/72 (BP Location: Left Arm, Patient Position: Sitting, Cuff Size: Large)   Pulse 77   Temp 99.4 F (37.4 C) (Oral)   Ht '5\' 11"'$  (1.803 m)   Wt 185 lb (83.9 kg)   SpO2 97%   BMI 25.80 kg/m   BP Readings from Last 3 Encounters:  06/05/21 114/72  03/02/20 110/64  06/11/18 132/64    Wt Readings from Last 3 Encounters:  06/05/21 185 lb (83.9 kg)  03/02/20 199 lb 2 oz (90.3 kg)  06/11/18 183 lb 8 oz (83.2 kg)    Physical Exam Vitals reviewed.  Constitutional:      Appearance: Normal appearance.  HENT:     Nose: Mucosal edema and congestion present. No nasal tenderness or rhinorrhea.     Right Nostril: No epistaxis.     Left Nostril: No epistaxis.     Right Turbinates: Not enlarged, swollen or pale.     Left Turbinates: Not enlarged, swollen or pale.     Mouth/Throat:     Mouth: Mucous membranes are moist.  Eyes:     General: No scleral icterus.    Conjunctiva/sclera: Conjunctivae normal.  Cardiovascular:     Rate and Rhythm: Normal rate and regular rhythm.     Heart sounds: No murmur heard. Pulmonary:     Effort: Pulmonary effort is normal.     Breath sounds: No stridor. No wheezing, rhonchi or rales.  Abdominal:     General: Abdomen is flat. Bowel sounds are normal. There is no distension.  Palpations: Abdomen is soft. There is no hepatomegaly, splenomegaly or mass.  Musculoskeletal:        General: Normal range of motion.     Cervical back: Neck supple.     Right lower leg: No edema.     Left lower leg: No edema.  Lymphadenopathy:     Cervical: No cervical adenopathy.  Skin:    General: Skin is warm and dry.  Neurological:     General: No focal deficit present.     Mental Status: He is alert.  Psychiatric:        Mood and Affect: Mood normal.        Behavior: Behavior normal.    Lab Results  Component Value Date   WBC 6.5 03/02/2020   HGB 14.6 03/02/2020   HCT 42.7 03/02/2020   PLT 200.0 03/02/2020    GLUCOSE 90 03/02/2020   CHOL 139 06/05/2021   TRIG 69.0 06/05/2021   HDL 50.50 06/05/2021   LDLCALC 75 06/05/2021   ALT 18 03/02/2020   AST 16 03/02/2020   NA 138 03/02/2020   K 4.2 03/02/2020   CL 102 03/02/2020   CREATININE 1.15 03/02/2020   BUN 17 03/02/2020   CO2 32 03/02/2020   TSH 1.37 03/02/2020   PSA 0.68 03/02/2020    DG Ankle Complete Right  Result Date: 06/11/2018 CLINICAL DATA:  Chronic LATERAL RIGHT ankle pain. No known injuries. EXAM: RIGHT ANKLE - COMPLETE 3+ VIEW COMPARISON:  None. FINDINGS: No evidence of acute fracture. Ankle mortise intact with well-preserved joint space. Well-preserved bone mineral density. No intrinsic osseous abnormalities. No visible joint effusion. IMPRESSION: Normal examination. Electronically Signed   By: Evangeline Dakin M.D.   On: 06/11/2018 11:37   DG Knee Complete 4 Views Left  Result Date: 06/11/2018 CLINICAL DATA:  Chronic LATERAL LEFT knee pain.  No known injuries. EXAM: LEFT KNEE - COMPLETE 4+ VIEW COMPARISON:  None. FINDINGS: No evidence of acute, subacute or healed fractures. Well-preserved joint spaces. Well-preserved bone mineral density. No intrinsic osseous abnormalities. No evidence of a joint effusion. Small enthesopathic spur at the insertion of the quadriceps tendon on the SUPERIOR patella. IMPRESSION: No significant osseous abnormality. Electronically Signed   By: Evangeline Dakin M.D.   On: 06/11/2018 11:37    Assessment & Plan:   Zecheriah was seen today for annual exam and sinusitis.  Diagnoses and all orders for this visit:  Non-seasonal allergic rhinitis, unspecified trigger -     methylPREDNISolone (MEDROL DOSEPAK) 4 MG TBPK tablet; TAKE AS DIRECTED  Acute non-recurrent sinusitis, unspecified location- His symptoms are consistent with a viral etiology, specifically COVID-19.  Will treat with a course of paxlovid. -     Novel Coronavirus, NAA ([facility])  Encounter for general adult medical examination with abnormal  findings- Exam completed, labs reviewed- Statin therapy is not indicated., vaccines reviewed and updated, no cancer screenings are indicated, patient education was given. -     Lipid panel; Future -     Hepatitis C antibody; Future -     Lipid panel -     Hepatitis C antibody  Need for hepatitis C screening test -     Hepatitis C antibody; Future -     Hepatitis C antibody  COVID- Will treat with paxlovid and methylprednisolone.  Other orders -     Tdap vaccine greater than or equal to 7yo IM -     SARS-COV-2, NAA 2 DAY TAT  I have discontinued Dolan Hullum's sildenafil. I am  also having him start on methylPREDNISolone. Additionally, I am having him maintain his multivitamin and Fish Oil.  Meds ordered this encounter  Medications   methylPREDNISolone (MEDROL DOSEPAK) 4 MG TBPK tablet    Sig: TAKE AS DIRECTED    Dispense:  21 tablet    Refill:  0      Follow-up: Return in about 1 year (around 06/05/2022).  Scarlette Calico, MD

## 2021-06-06 ENCOUNTER — Encounter: Payer: Self-pay | Admitting: Internal Medicine

## 2021-06-06 ENCOUNTER — Telehealth: Payer: Self-pay | Admitting: Internal Medicine

## 2021-06-06 DIAGNOSIS — U071 COVID-19: Secondary | ICD-10-CM | POA: Insufficient documentation

## 2021-06-06 DIAGNOSIS — Z1159 Encounter for screening for other viral diseases: Secondary | ICD-10-CM | POA: Insufficient documentation

## 2021-06-06 LAB — HEPATITIS C ANTIBODY
Hepatitis C Ab: NONREACTIVE
SIGNAL TO CUT-OFF: 0.02 (ref <1.00)

## 2021-06-06 LAB — SARS-COV-2, NAA 2 DAY TAT

## 2021-06-06 LAB — NOVEL CORONAVIRUS, NAA: SARS-CoV-2, NAA: DETECTED — AB

## 2021-06-06 MED ORDER — PAXLOVID 20 X 150 MG & 10 X 100MG PO TBPK: 3.0000 | ORAL_TABLET | Freq: Two times a day (BID) | ORAL | 0 refills | Status: AC

## 2021-06-06 NOTE — Telephone Encounter (Signed)
Pt has been informed that result is not back yet. We will call once it has been reviewed by PCP.

## 2021-06-06 NOTE — Telephone Encounter (Signed)
Patient called in to check status of COVID test yesterday  Patient is requesting to hear about results as soon as possible bc he needs to go back to work  Please followup [phone]

## 2022-10-10 ENCOUNTER — Ambulatory Visit (INDEPENDENT_AMBULATORY_CARE_PROVIDER_SITE_OTHER): Payer: BC Managed Care – PPO

## 2022-10-10 ENCOUNTER — Ambulatory Visit (INDEPENDENT_AMBULATORY_CARE_PROVIDER_SITE_OTHER): Payer: BC Managed Care – PPO | Admitting: Internal Medicine

## 2022-10-10 ENCOUNTER — Encounter: Payer: Self-pay | Admitting: Internal Medicine

## 2022-10-10 VITALS — BP 122/78 | HR 67 | Temp 98.7°F | Ht 71.0 in | Wt 190.0 lb

## 2022-10-10 DIAGNOSIS — G8929 Other chronic pain: Secondary | ICD-10-CM | POA: Diagnosis not present

## 2022-10-10 DIAGNOSIS — Z0001 Encounter for general adult medical examination with abnormal findings: Secondary | ICD-10-CM

## 2022-10-10 DIAGNOSIS — M25512 Pain in left shoulder: Secondary | ICD-10-CM

## 2022-10-10 LAB — LIPID PANEL
Cholesterol: 154 mg/dL (ref 0–200)
HDL: 50.9 mg/dL (ref >39.00)
LDL Cholesterol: 93 mg/dL (ref 0–99)
NonHDL: 103.55
Total CHOL/HDL Ratio: 3
Triglycerides: 51 mg/dL (ref 0.0–149.0)
VLDL: 10.2 mg/dL (ref 0.0–40.0)

## 2022-10-10 NOTE — Progress Notes (Signed)
Subjective:  Patient ID: Triston Skare, male    DOB: [birth year]  Age: 41 y.o. MRN: [id number]  CC: Annual Exam and Shoulder Pain   HPI Deyvi Bonanno presents for a CPX and f/up -  He complains of a 3 month hx of left shoulder pain after doing 100 push-ups a day. He describes it as a throbbing discomfort that does not limit his ROM. He is controlling the pain with apap.  Outpatient Medications Prior to Visit  Medication Sig Dispense Refill   Multiple Vitamin (MULTIVITAMIN) tablet Take 1 tablet by mouth daily.     Omega-3 Fatty Acids (FISH OIL) 500 MG CAPS Take by mouth.     No facility-administered medications prior to visit.    ROS Review of Systems  Constitutional: Negative.  Negative for diaphoresis and fatigue.  HENT: Negative.    Eyes: Negative.   Respiratory:  Negative for cough, chest tightness, shortness of breath and wheezing.   Cardiovascular:  Negative for chest pain, palpitations and leg swelling.  Gastrointestinal:  Negative for abdominal pain, constipation, diarrhea, nausea and vomiting.  Endocrine: Negative.   Genitourinary: Negative.  Negative for difficulty urinating.  Musculoskeletal:  Positive for arthralgias. Negative for back pain, myalgias and neck pain.  Skin: Negative.   Neurological:  Negative for dizziness, weakness and numbness.  Hematological:  Negative for adenopathy. Does not bruise/bleed easily.  Psychiatric/Behavioral: Negative.      Objective:  BP 122/78 (BP Location: Left Arm, Patient Position: Sitting, Cuff Size: Large)   Pulse 67   Temp 98.7 F (37.1 C) (Oral)   Ht '5\' 11"'$  (1.803 m)   Wt 190 lb (86.2 kg)   SpO2 97%   BMI 26.50 kg/m   BP Readings from Last 3 Encounters:  10/10/22 122/78  06/05/21 114/72  03/02/20 110/64    Wt Readings from Last 3 Encounters:  10/10/22 190 lb (86.2 kg)  06/05/21 185 lb (83.9 kg)  03/02/20 199 lb 2 oz (90.3 kg)    Physical Exam Vitals reviewed.  HENT:     Nose: Nose normal.      Mouth/Throat:     Mouth: Mucous membranes are moist.  Eyes:     General: No scleral icterus.    Conjunctiva/sclera: Conjunctivae normal.  Cardiovascular:     Rate and Rhythm: Normal rate.     Heart sounds: No murmur heard. Pulmonary:     Effort: Pulmonary effort is normal.     Breath sounds: No stridor. No wheezing, rhonchi or rales.  Abdominal:     General: Abdomen is flat.     Palpations: There is no mass.     Tenderness: There is no abdominal tenderness. There is no guarding.     Hernia: No hernia is present.  Musculoskeletal:        General: Normal range of motion.     Right shoulder: Normal.     Left shoulder: Normal. No swelling, deformity, bony tenderness or crepitus. Normal range of motion.     Cervical back: Normal and neck supple.     Thoracic back: Normal.     Right lower leg: No edema.  Lymphadenopathy:     Cervical: No cervical adenopathy.  Skin:    General: Skin is warm.  Neurological:     General: No focal deficit present.     Mental Status: He is alert. Mental status is at baseline.  Psychiatric:        Mood and Affect: Mood normal.  Behavior: Behavior normal.     Lab Results  Component Value Date   WBC 6.5 03/02/2020   HGB 14.6 03/02/2020   HCT 42.7 03/02/2020   PLT 200.0 03/02/2020   GLUCOSE 90 03/02/2020   CHOL 154 10/10/2022   TRIG 51.0 10/10/2022   HDL 50.90 10/10/2022   LDLCALC 93 10/10/2022   ALT 18 03/02/2020   AST 16 03/02/2020   NA 138 03/02/2020   K 4.2 03/02/2020   CL 102 03/02/2020   CREATININE 1.15 03/02/2020   BUN 17 03/02/2020   CO2 32 03/02/2020   TSH 1.37 03/02/2020   PSA 0.68 03/02/2020    DG Ankle Complete Right  Result Date: 06/11/2018 CLINICAL DATA:  Chronic LATERAL RIGHT ankle pain. No known injuries. EXAM: RIGHT ANKLE - COMPLETE 3+ VIEW COMPARISON:  None. FINDINGS: No evidence of acute fracture. Ankle mortise intact with well-preserved joint space. Well-preserved bone mineral density. No intrinsic osseous  abnormalities. No visible joint effusion. IMPRESSION: Normal examination. Electronically Signed   By: Evangeline Dakin M.D.   On: 06/11/2018 11:37   DG Knee Complete 4 Views Left  Result Date: 06/11/2018 CLINICAL DATA:  Chronic LATERAL LEFT knee pain.  No known injuries. EXAM: LEFT KNEE - COMPLETE 4+ VIEW COMPARISON:  None. FINDINGS: No evidence of acute, subacute or healed fractures. Well-preserved joint spaces. Well-preserved bone mineral density. No intrinsic osseous abnormalities. No evidence of a joint effusion. Small enthesopathic spur at the insertion of the quadriceps tendon on the SUPERIOR patella. IMPRESSION: No significant osseous abnormality. Electronically Signed   By: Evangeline Dakin M.D.   On: 06/11/2018 11:37   DG Shoulder Left  Result Date: 10/11/2022 CLINICAL DATA:  Pain for 3 months. Chronic pain of left shoulder. EXAM: LEFT SHOULDER - 2+ VIEW COMPARISON:  None Available. FINDINGS: There is no evidence of fracture or dislocation. Normal alignment and joint spaces. There is no evidence of arthropathy or other focal bone abnormality. Soft tissues are unremarkable. No soft tissue calcifications. IMPRESSION: Negative radiographs of the left shoulder. Electronically Signed   By: Keith Rake M.D.   On: 10/11/2022 10:57       Assessment & Plan:   Vester was seen today for annual exam and shoulder pain.  Diagnoses and all orders for this visit:  Encounter for general adult medical examination with abnormal findings- Exam completed, labs reviewed-statin is not indicated, vaccines reviewed and updated, no cancer screenings indicated, patient education was given. -     Lipid panel; Future -     Lipid panel  Chronic left shoulder pain- Exam and x-ray are normal.  This is consistent with overuse syndrome. -     DG Shoulder Left; Future   I am having Glennon Mac maintain his multivitamin and Fish Oil.  No orders of the defined types were placed in this  encounter.    Follow-up: Return if symptoms worsen or fail to improve.  Scarlette Calico, MD

## 2022-10-10 NOTE — Patient Instructions (Signed)
Shoulder Pain Many things can cause shoulder pain, including: An injury to the shoulder. Overuse of the shoulder. Arthritis. The source of the pain can be: Inflammation. An injury to the shoulder joint. An injury to a tendon, ligament, or bone. Follow these instructions at home: Pay attention to changes in your symptoms. Let your health care provider know about them. Follow these instructions to relieve your pain. If you have a sling: Wear the sling as told by your health care provider. Remove it only as told by your health care provider. Loosen the sling if your fingers tingle, become numb, or turn cold and blue. Keep the sling clean. If the sling is not waterproof: Do not let it get wet. Remove it to shower or bathe. Move your arm as little as possible, but keep your hand moving to prevent swelling. Managing pain, stiffness, and swelling  If directed, put ice on the painful area: Put ice in a plastic bag. Place a towel between your skin and the bag. Leave the ice on for 20 minutes, 2-3 times per day. Stop applying ice if it does not help with the pain. Squeeze a soft ball or a foam pad as much as possible. This helps to keep the shoulder from swelling. It also helps to strengthen the arm. General instructions Take over-the-counter and prescription medicines only as told by your health care provider. Keep all follow-up visits as told by your health care provider. This is important. Contact a health care provider if: Your pain gets worse. Your pain is not relieved with medicines. New pain develops in your arm, hand, or fingers. Get help right away if: Your arm, hand, or fingers: Tingle. Become numb. Become swollen. Become painful. Turn white or blue. Summary Shoulder pain can be caused by an injury, overuse, or arthritis. Pay attention to changes in your symptoms. Let your health care provider know about them. This condition may be treated with a sling, ice, and pain  medicines. Contact your health care provider if the pain gets worse or new pain develops. Get help right away if your arm, hand, or fingers tingle or become numb, swollen, or painful. Keep all follow-up visits as told by your health care provider. This is important. This information is not intended to replace advice given to you by your health care provider. Make sure you discuss any questions you have with your health care provider. Document Revised: 06/29/2021 Document Reviewed: 06/29/2021 Elsevier Patient Education  [place].

## 2022-12-22 ENCOUNTER — Encounter: Payer: Self-pay | Admitting: Internal Medicine

## 2022-12-25 ENCOUNTER — Other Ambulatory Visit: Payer: Self-pay | Admitting: Internal Medicine

## 2022-12-25 DIAGNOSIS — G8929 Other chronic pain: Secondary | ICD-10-CM

## 2022-12-27 NOTE — Progress Notes (Signed)
IStevenson Clinch, CMA acting as a Neurosurgeon for Stephen Graham, MD.  Subjective:    CC: L shoulder pain (RHD)  HPI: Pt is a 42 y/o male c/o L shoulder pain ongoing since around Aug. [id number] related to doing push-ups.  Pt locates pain to the upper arm, "ripping sensation" in the upper arm, throbbing. Notes having pain when handing son off to mother. Limited ROM with reaching back and reaching overhead.   Radiates: upper arm Mechanical symptoms: yes Numbness/tingling: no Weakness: no Aggravates: holding son, reaching overhead and reaching back Treatments tried: rest  Dx imaging: 10/10/22 L shoulder XR  Pertinent review of Systems: No fevers or chills  Relevant historical information: Eczema or lichen simplex chronicus   Objective:    Vitals:   12/30/22 0746  BP: 120/80  Pulse: (!) 57  SpO2: 97%   General: Well Developed, well nourished, and in no acute distress.   MSK: Left shoulder Normal-appearing Nontender. Normal motion pain with abduction. Intact strength pain with abduction. Positive Hawkins and Neer's test. Positive empty can test. Negative Yergason's and speeds test.  Lab and Radiology Results  Diagnostic Limited MSK Ultrasound of: Left shoulder Biceps tendon intact normal-appearing Subscapularis tendon normal-appearing Supraspinatus tendon intact with mild to moderate subacromial bursitis. Infraspinatus tendon normal-appearing AC joint normal-appearing Impression: Subacromial bursitis  DG Shoulder Left  Result Date: 10/11/2022 CLINICAL DATA:  Pain for 3 months. Chronic pain of left shoulder. EXAM: LEFT SHOULDER - 2+ VIEW COMPARISON:  None Available. FINDINGS: There is no evidence of fracture or dislocation. Normal alignment and joint spaces. There is no evidence of arthropathy or other focal bone abnormality. Soft tissues are unremarkable. No soft tissue calcifications. IMPRESSION: Negative radiographs of the left shoulder. Electronically Signed    By: Narda Rutherford M.D.   On: 10/11/2022 10:57   I, Stephen Nichols, personally (independently) visualized and performed the interpretation of the images attached in this note.   Impression and Recommendations:    Assessment and Plan: 42 y.o. male with left shoulder pain.  Pain has occurred slowly worsening over the last year without injury.  He made it worse by starting a new exercise program where he was trying to do 100 push-ups a day.  He has not been able to get the pain to go away by stopping the push-up program.  I think the source of pain is subacromial impingement and bursitis.  He is a great candidate for physical therapy trial.  Plan to refer to PT.  Will use the [facility] care location as he lives sort of near that area.  If that will not work because of work hour issues then we could use [facility] or [facility] in that area as well.  Recheck in about 8 weeks  We also talked about vasectomies a little bit.  I recommended [facility].  He is already thinking about it and will make a phone call.  PDMP not reviewed this encounter. Orders Placed This Encounter  Procedures   [place] LIMITED JOINT SPACE STRUCTURES UP LEFT(NO LINKED CHARGES)    Order Specific Question:   Reason for Exam (SYMPTOM  OR DIAGNOSIS REQUIRED)    Answer:   left shoudler [place]    Order Specific Question:   Preferred imaging location?    Answer:   [facility] referral to Physical Therapy    Referral Priority:   Routine    Referral Type:   Physical Medicine    Referral Reason:  Specialty Services Required    Requested Specialty:   Physical Therapy    Number of Visits Requested:   1   No orders of the defined types were placed in this encounter.   Discussed warning signs or symptoms. Please see discharge instructions. Patient expresses understanding.   The above documentation has been reviewed and is accurate and complete Stephen Nichols, M.D.

## 2022-12-30 ENCOUNTER — Ambulatory Visit (INDEPENDENT_AMBULATORY_CARE_PROVIDER_SITE_OTHER): Payer: No Typology Code available for payment source | Admitting: Family Medicine

## 2022-12-30 ENCOUNTER — Ambulatory Visit: Payer: Self-pay

## 2022-12-30 VITALS — BP 120/80 | HR 57 | Ht 71.0 in | Wt 199.0 lb

## 2022-12-30 DIAGNOSIS — M25512 Pain in left shoulder: Secondary | ICD-10-CM | POA: Diagnosis not present

## 2022-12-30 DIAGNOSIS — G8929 Other chronic pain: Secondary | ICD-10-CM | POA: Diagnosis not present

## 2022-12-30 NOTE — Patient Instructions (Signed)
Thank you for coming in today.   I've referred you to Physical Therapy.  Let us know if you don't hear from them in one week.   Recheck in 8 weeks.   For lifting with shoulder pain keep your hand in your peripheral vision while looking forward.

## 2023-01-10 ENCOUNTER — Other Ambulatory Visit: Payer: Self-pay

## 2023-01-10 ENCOUNTER — Ambulatory Visit: Payer: No Typology Code available for payment source | Admitting: Physical Therapy

## 2023-01-10 ENCOUNTER — Encounter: Payer: Self-pay | Admitting: Physical Therapy

## 2023-01-10 DIAGNOSIS — M25612 Stiffness of left shoulder, not elsewhere classified: Secondary | ICD-10-CM

## 2023-01-10 DIAGNOSIS — R29898 Other symptoms and signs involving the musculoskeletal system: Secondary | ICD-10-CM

## 2023-01-10 DIAGNOSIS — M25512 Pain in left shoulder: Secondary | ICD-10-CM

## 2023-01-10 DIAGNOSIS — R252 Cramp and spasm: Secondary | ICD-10-CM | POA: Diagnosis not present

## 2023-01-10 DIAGNOSIS — G8929 Other chronic pain: Secondary | ICD-10-CM

## 2023-01-10 NOTE — Therapy (Addendum)
OUTPATIENT PHYSICAL THERAPY SHOULDER EVALUATION  /DISCHARGE   Patient Name: Stephen Nichols MRN: [id number] DOB:[birth year], 42 y.o., male Today's Date: 01/10/2023  END OF SESSION:  PT End of Session - 01/10/23 0839     Visit Number 1    Number of Visits 7    Date for PT Re-Evaluation 02/21/23    Authorization Type [employer]    Authorization Time Period 01/10/23 to 02/21/23    Authorization - Number of Visits 60    PT Start Time 0808    PT Stop Time 0848    PT Time Calculation (min) 40 min    Activity Tolerance Patient tolerated treatment well    Behavior During Therapy [facility] for tasks assessed/performed             History reviewed. No pertinent past medical history. History reviewed. No pertinent surgical history. Patient Active Problem List   Diagnosis Date Noted   Chronic left shoulder pain 10/10/2022   Non-seasonal allergic rhinitis 06/05/2021   Encounter for general adult medical examination with abnormal findings 06/05/2021   Erectile dysfunction 03/02/2020   Primary osteoarthritis involving multiple joints 06/11/2018   Eczema 11/16/2012    PCP: Sanda Linger MD   REFERRING PROVIDER: Rodolph Bong, MD  REFERRING DIAG: [phone] (ICD-10-CM) - Chronic left shoulder pain  THERAPY DIAG:  Chronic left shoulder pain  Stiffness of left shoulder, not elsewhere classified  Cramp and spasm  Other symptoms and signs involving the musculoskeletal system  Rationale for Evaluation and Treatment: Rehabilitation  ONSET DATE: 12/30/2022  SUBJECTIVE:                                                                                                                                                                                      SUBJECTIVE STATEMENT:  Hurt my shoulder trying to do 100 pushups and 100 sit ups every day, was trying to get myself right but then my left shoulder started hurting really bad. Even sitting still it throbs, it started in the triceps now its all  the front of the shoulder. Happened over a year ago. Started with a radiating pain in triceps, I backed off pushups and this went away, now its in the front of my shoulder. Still able to do all my normal stuff. Was doing every version of pushups out there.   PERTINENT HISTORY: Assessment and Plan: 42 y.o. male with left shoulder pain.  Pain has occurred slowly worsening over the last year without injury.  He made it worse by starting a new exercise program where he was trying to do 100 push-ups a day.  He has not been able to get the pain to go  away by stopping the push-up program.   I think the source of pain is subacromial impingement and bursitis.  He is a great candidate for physical therapy trial.  Plan to refer to PT.  Will use the [facility] care location as he lives sort of near that area.  If that will not work because of work hour issues then we could use [facility] or [facility] in that area as well.  PAIN:  Are you having pain? Yes: NPRS scale: 0-1/10 Pain location: anterior L shoulder  Pain description: sore Aggravating factors: just trying to use it, motions in general Relieving factors: rest  PRECAUTIONS: None  WEIGHT BEARING RESTRICTIONS: No  FALLS:  Has patient fallen in last 6 months? No  LIVING ENVIRONMENT: Lives with: lives with their family and lives with their spouse Lives in: House/apartment Stairs: no STE, flight inside  Has following equipment at home: None  OCCUPATION: Production designer, theatre/television/film at [employer], banking   PLOF: Independent, Independent with basic ADLs, Independent with gait, and Independent with transfers  PATIENT GOALS:increase ROM without pain  NEXT MD VISIT: Dr. Denyse Amass after PT   OBJECTIVE:   DIAGNOSTIC FINDINGS:    PATIENT SURVEYS:  Will do FOTO second session   COGNITION: Overall cognitive status: Within functional limits for tasks assessed     SENSATION: Not tested no complaints  POSTURE: Increased thoracic kyphosis, rounded  shoulders, forward head   UPPER EXTREMITY ROM:   Active ROM Right eval Left eval  Shoulder flexion 172 140  Shoulder extension    Shoulder abduction 178 146  Shoulder adduction    Shoulder internal rotation FIR T7 FIR T8  Shoulder external rotation FER C5  FER C7   Elbow flexion    Elbow extension    Wrist flexion    Wrist extension    Wrist ulnar deviation    Wrist radial deviation    Wrist pronation    Wrist supination    (Blank rows = not tested)  UPPER EXTREMITY MMT:  MMT Right eval Left eval  Shoulder flexion 4+ 4+  Shoulder extension 4 4  Shoulder abduction 4+ 4+  Shoulder adduction    Shoulder internal rotation    Shoulder external rotation    Middle trapezius 4+ 4+  Lower trapezius 3- 3-  Elbow flexion    Elbow extension    Wrist flexion    Wrist extension    Wrist ulnar deviation    Wrist radial deviation    Wrist pronation    Wrist supination    Grip strength (lbs)    (Blank rows = not tested)  SHOULDER SPECIAL TESTS: Noted mild scapular winging with UE elevation   JOINT MOBILITY TESTING:    PALPATION:   Noted spasm in L anterior delt and L UT    TODAY'S TREATMENT:  DATE:   Eval  Objective assessment, appropriate education   TherEx  Blackburn 6 1# x12 B Shoulder flexion stretch on doorway 10x10 seconds L UE  ER stretch on doorframe 10x10 seconds L UE  Pec stretch in corner 2x30 seconds    PATIENT EDUCATION: Education details: HEP, POC, exam findings, mechanics of overuse injuries, importance of warm-up/cool-down and tapered progressive activities instead of trying to exercise at 100% effort when cold  Person educated: Patient Education method: Explanation Education comprehension: verbalized understanding and returned demonstration  HOME EXERCISE PROGRAM:  Blackburn 6 with 1#  + HEP as  blow  Access Code: [email or web address] URL: [email or web address] Date: 01/10/2023 Prepared by: Deniece Ree  Exercises - Standing Single Arm Shoulder Flexion Stretch on Wall  - 1-2 x daily - 7 x weekly - 1 sets - 10 reps - 10 hold - Standing Shoulder External Rotation Stretch in Doorway  - 1-2 x daily - 7 x weekly - 1 sets - 10 reps - 10 hold - Corner Pec Major Stretch  - 1-2 x daily - 7 x weekly - 1 sets - 3 reps - 30 hold  ASSESSMENT:  CLINICAL IMPRESSION: Patient is a 42 y.o. M who was seen today for physical therapy evaluation and treatment for chronic L shoulder pain. Per his history, sounds like a classic overuse injury that was never properly addressed. Exam reveals significant postural limitations, functional muscle weakness especially in periscapular muscle groups, limited L shoulder ROM, mm spasm in L anterior delt/proximal biceps/pecs/distal L upper trap. I think he will really benefit a lot from and respond well to PT to address all functional limitations and pain moving forward.   OBJECTIVE IMPAIRMENTS: decreased ROM, decreased strength, increased fascial restrictions, increased muscle spasms, impaired flexibility, impaired UE functional use, improper body mechanics, postural dysfunction, and pain.   ACTIVITY LIMITATIONS: carrying, lifting, reach over head, and caring for others  PARTICIPATION LIMITATIONS: meal prep, cleaning, laundry, shopping, community activity, occupation, and yard work  PERSONAL FACTORS: Fitness, Past/current experiences, Profession, and Time since onset of injury/illness/exacerbation are also affecting patient's functional outcome.   REHAB POTENTIAL: Excellent  CLINICAL DECISION MAKING: Stable/uncomplicated  EVALUATION COMPLEXITY: Low   GOALS: Goals reviewed with patient? Yes  SHORT TERM GOALS: Target date: 01/31/2023    Will be compliant with appropriate progressive HEP  Baseline: Goal status: INITIAL  2.  L shoulder AROM to be equal  to R shoulder AROM all planes  Baseline:  Goal status: INITIAL  3.  Pain to be 0/10 with all functional tasks  Baseline:  Goal status: INITIAL   LONG TERM GOALS: Target date: 02/21/2023    MMT to have improved by one grade in all weak groups  Baseline:  Goal status: INITIAL  2.  Scapular winging with active UE elevation to have resolved  Baseline:  Goal status: INITIAL  3.  Will be able to hold children/perform childcare without increase in pain L UE  Baseline:  Goal status: INITIAL  4.  Will be able to participate in functional gym based activities without increase in pain and with reasonable knowledge of tapered and progressive exercise training to prevent recurrence of injury  Baseline:  Goal status: INITIAL   PLAN:  PT FREQUENCY: 1x/week  PT DURATION: 6 weeks  PLANNED INTERVENTIONS: Therapeutic exercises, Therapeutic activity, Neuromuscular re-education, Balance training, Gait training, Patient/Family education, Self Care, Joint mobilization, Dry Needling, Electrical stimulation, Cryotherapy, Moist heat, Taping, Ultrasound, Ionotophoresis 4mg /ml Dexamethasone, Manual therapy, and Re-evaluation  PLAN FOR NEXT SESSION: how is  shoulder feeling? ROM and strength as appropriate, transition back to gym level activities as appropriate, consider DN  Nedra Hai PT DPT PN2     PHYSICAL THERAPY DISCHARGE SUMMARY  Visits from Start of Care: 1  Current functional level related to goals / functional outcomes: See note   Remaining deficits: See note   Education / Equipment: HEP  Patient goals were not met. Patient is being discharged due to not returning since the last visit.  Chyrel Masson, PT, DPT, OCS, ATC 02/27/23  3:09 PM

## 2023-01-14 ENCOUNTER — Encounter: Payer: No Typology Code available for payment source | Admitting: Rehabilitative and Restorative Service Providers"

## 2023-01-22 ENCOUNTER — Encounter: Payer: No Typology Code available for payment source | Admitting: Physical Therapy

## 2023-01-29 ENCOUNTER — Encounter: Payer: No Typology Code available for payment source | Admitting: Physical Therapy

## 2023-02-05 ENCOUNTER — Encounter: Payer: No Typology Code available for payment source | Admitting: Physical Therapy

## 2023-02-12 ENCOUNTER — Encounter: Payer: No Typology Code available for payment source | Admitting: Physical Therapy

## 2023-02-19 ENCOUNTER — Encounter: Payer: No Typology Code available for payment source | Admitting: Physical Therapy

## 2023-02-24 NOTE — Progress Notes (Unsigned)
   Rubin Payor, PhD, LAT, ATC acting as a scribe for Clementeen Graham, MD.  Stephen Nichols is a 42 y.o. male who presents to [facility] today for f/u chronic L shoulder pain. He is R-hand dominate. Pt was last seen by Dr. Denyse Amass on 12/30/22 and was referred to PT, completing 1 visit (canceling all remaining visits). Today, pt reports he has a newborn at home, so he wasn't able to commit to PT. He has been working on [employer] they provided. He has been increasing AROM. Pt feels the L shoulder is somewhat improved and will only have pain w/ certain motions/activities.   Dx imaging: 10/10/22 L shoulder XR   Pertinent review of systems: No fevers or chills  Relevant historical information: Eczema   Exam:  BP 122/78   Pulse 61   Ht 5\' 11"  (1.803 m)   Wt 201 lb (91.2 kg)   SpO2 96%   BMI 28.03 kg/m  General: Well Developed, well nourished, and in no acute distress.   MSK: Left shoulder normal-appearing normal motion minimal pain with full abduction Intact strength.     Assessment and Plan: 42 y.o. male with chronic left shoulder pain.  Doing pretty well with PT directed home exercise program.  He is slowly improving which is a good outcome for now.  Next steps if needed would be further physical therapy or an injection.  His symptoms are not bad enough at this time to do either.  He is too busy with his work, taking care of his teenage children and taking care of his newborn.     Discussed warning signs or symptoms. Please see discharge instructions. Patient expresses understanding.   The above documentation has been reviewed and is accurate and complete Clementeen Graham, M.D.

## 2023-02-25 ENCOUNTER — Ambulatory Visit: Payer: No Typology Code available for payment source | Admitting: Family Medicine

## 2023-02-25 VITALS — BP 122/78 | HR 61 | Ht 71.0 in | Wt 201.0 lb

## 2023-02-25 DIAGNOSIS — M25512 Pain in left shoulder: Secondary | ICD-10-CM | POA: Diagnosis not present

## 2023-02-25 DIAGNOSIS — G8929 Other chronic pain: Secondary | ICD-10-CM

## 2023-02-25 NOTE — Patient Instructions (Signed)
Thank you for coming in today.   Keep working the home exercises.   I can do a shot any time.   We can re try a little PT in the future if needed.   Youtube is ok for exercises.

## 2023-02-26 ENCOUNTER — Encounter: Payer: No Typology Code available for payment source | Admitting: Physical Therapy

## 2024-09-15 ENCOUNTER — Ambulatory Visit: Payer: Self-pay | Admitting: Internal Medicine

## 2024-09-15 ENCOUNTER — Ambulatory Visit (INDEPENDENT_AMBULATORY_CARE_PROVIDER_SITE_OTHER): Admitting: Internal Medicine

## 2024-09-15 ENCOUNTER — Encounter: Payer: Self-pay | Admitting: Internal Medicine

## 2024-09-15 VITALS — BP 114/68 | HR 52 | Temp 98.9°F | Ht 71.5 in | Wt 175.0 lb

## 2024-09-15 DIAGNOSIS — R001 Bradycardia, unspecified: Secondary | ICD-10-CM | POA: Diagnosis not present

## 2024-09-15 DIAGNOSIS — Z0000 Encounter for general adult medical examination without abnormal findings: Secondary | ICD-10-CM

## 2024-09-15 DIAGNOSIS — Z0001 Encounter for general adult medical examination with abnormal findings: Secondary | ICD-10-CM

## 2024-09-15 LAB — HEPATIC FUNCTION PANEL
ALT: 13 U/L (ref 0–53)
AST: 14 U/L (ref 0–37)
Albumin: 4.5 g/dL (ref 3.5–5.2)
Alkaline Phosphatase: 64 U/L (ref 39–117)
Bilirubin, Direct: 0.2 mg/dL (ref 0.0–0.3)
Total Bilirubin: 0.8 mg/dL (ref 0.2–1.2)
Total Protein: 7 g/dL (ref 6.0–8.3)

## 2024-09-15 LAB — CBC WITH DIFFERENTIAL/PLATELET
Basophils Absolute: 0.1 K/uL (ref 0.0–0.1)
Basophils Relative: 1.1 % (ref 0.0–3.0)
Eosinophils Absolute: 0.2 K/uL (ref 0.0–0.7)
Eosinophils Relative: 3.3 % (ref 0.0–5.0)
HCT: 41.3 % (ref 39.0–52.0)
Hemoglobin: 14.2 g/dL (ref 13.0–17.0)
Lymphocytes Relative: 28.1 % (ref 12.0–46.0)
Lymphs Abs: 2 K/uL (ref 0.7–4.0)
MCHC: 34.3 g/dL (ref 30.0–36.0)
MCV: 77.9 fl — ABNORMAL LOW (ref 78.0–100.0)
Monocytes Absolute: 0.6 K/uL (ref 0.1–1.0)
Monocytes Relative: 8.7 % (ref 3.0–12.0)
Neutro Abs: 4.1 K/uL (ref 1.4–7.7)
Neutrophils Relative %: 58.8 % (ref 43.0–77.0)
Platelets: 163 K/uL (ref 150.0–400.0)
RBC: 5.3 Mil/uL (ref 4.22–5.81)
RDW: 13.6 % (ref 11.5–15.5)
WBC: 7 K/uL (ref 4.0–10.5)

## 2024-09-15 LAB — LIPID PANEL
Cholesterol: 153 mg/dL (ref 0–200)
HDL: 54.2 mg/dL (ref >39.00)
LDL Cholesterol: 90 mg/dL (ref 0–99)
NonHDL: 98.84
Total CHOL/HDL Ratio: 3
Triglycerides: 44 mg/dL (ref 0.0–149.0)
VLDL: 8.8 mg/dL (ref 0.0–40.0)

## 2024-09-15 LAB — PSA: PSA: 1.13 ng/mL (ref 0.10–4.00)

## 2024-09-15 LAB — BASIC METABOLIC PANEL WITH GFR
BUN: 10 mg/dL (ref 6–23)
CO2: 31 meq/L (ref 19–32)
Calcium: 9.1 mg/dL (ref 8.4–10.5)
Chloride: 104 meq/L (ref 96–112)
Creatinine, Ser: 1.08 mg/dL (ref 0.40–1.50)
GFR: 84.15 mL/min (ref >60.00)
Glucose, Bld: 86 mg/dL (ref 70–99)
Potassium: 4.3 meq/L (ref 3.5–5.1)
Sodium: 141 meq/L (ref 135–145)

## 2024-09-15 LAB — TSH: TSH: 1.1 u[IU]/mL (ref 0.35–5.50)

## 2024-09-15 NOTE — Progress Notes (Unsigned)
 Subjective:  Patient ID: Stephen Nichols, male    DOB: 04/10/1981  Age: 43 y.o. MRN: [id number]  CC: Annual Exam   HPI Stephen Nichols presents for a CPX and f/up  ---  Discussed the use of AI scribe software for clinical note transcription with the patient, who gave verbal consent to proceed.  History of Present Illness Stephen Nichols is a 43 year old male who presents for evaluation of low heart rate.  He feels well overall and has no symptoms associated with his low heart rate, such as dizziness or lightheadedness. He reports that he runs about 30 minutes a day, five to six times a week. He did not run on the day of the visit to avoid skewing his heart rate measurements.  No chest pain, shortness of breath, or cold sweats during exertion. He experiences heavy sweating during exercise but not cold sweats.  He does not take any medications or vitamins. 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  He mentions gaining 30 to 40 pounds during his wife's pregnancy, which he has since worked to lose through regular exercise. 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 He finds running to be a mental release and a part of his daily routine.   Outpatient Medications Prior to Visit  Medication Sig Dispense Refill   Multiple Vitamin (MULTIVITAMIN) tablet Take 1 tablet by mouth daily.     Omega-3 Fatty Acids (FISH OIL) 500 MG CAPS Take by mouth.     No facility-administered medications prior to visit.    ROS Review of Systems  Objective:  BP 114/68 (BP Location: Left Arm, Patient Position: Sitting, Cuff Size: Normal)   Pulse (!) 52   Temp 98.9 F (37.2 C) (Oral)   Ht 5' 11.5 (1.816 m)   Wt 175 lb (79.4 kg)   SpO2 99%   BMI 24.07 kg/m   BP Readings from Last 3 Encounters:  09/15/24 114/68  02/25/23 122/78  12/30/22 120/80    Wt Readings from Last 3 Encounters:   09/15/24 175 lb (79.4 kg)  02/25/23 201 lb (91.2 kg)  12/30/22 199 lb (90.3 kg)    Physical Exam Vitals reviewed.  Constitutional:      Appearance: Normal appearance.  HENT:     Mouth/Throat:     Mouth: Mucous membranes are moist.  Eyes:     General: No scleral icterus.    Conjunctiva/sclera: Conjunctivae normal.  Cardiovascular:     Rate and Rhythm: Regular rhythm. Bradycardia present.     Heart sounds: Normal heart sounds, S1 normal and S2 normal. No murmur heard.    No friction rub. No gallop.     Comments: EKG --- SB, 45 bpm No LVH, Q waves, or ST/T wave changes  Pulmonary:     Effort: Pulmonary effort is normal.     Breath sounds: No stridor. No wheezing, rhonchi or rales.  Abdominal:     General: Abdomen is flat.     Palpations: There is no mass.     Tenderness: There is no abdominal tenderness. There is no guarding.     Hernia: No hernia is present. There is no hernia in the left inguinal area or right inguinal area.  Genitourinary:    Pubic Area: No rash.      Penis: Normal and circumcised.      Testes: Normal.  Epididymis:     Right: Normal.     Left: Normal.     Prostate: Normal. Not enlarged, not tender and no nodules present.     Rectum: Normal. Guaiac result negative. No mass, tenderness, anal fissure, external hemorrhoid or internal hemorrhoid. Normal anal tone.  Musculoskeletal:     Cervical back: Neck supple.     Right lower leg: No edema.     Left lower leg: No edema.  Lymphadenopathy:     Lower Body: No right inguinal adenopathy. No left inguinal adenopathy.  Neurological:     Mental Status: He is alert.     Lab Results  Component Value Date   WBC 7.0 09/15/2024   HGB 14.2 09/15/2024   HCT 41.3 09/15/2024   PLT 163.0 09/15/2024   GLUCOSE 86 09/15/2024   CHOL 153 09/15/2024   TRIG 44.0 09/15/2024   HDL 54.20 09/15/2024   LDLCALC 90 09/15/2024   ALT 13 09/15/2024   AST 14 09/15/2024   NA 141 09/15/2024   K 4.3 09/15/2024   CL 104  09/15/2024   CREATININE 1.08 09/15/2024   BUN 10 09/15/2024   CO2 31 09/15/2024   TSH 1.10 09/15/2024   PSA 1.13 09/15/2024    DG Ankle Complete Right Result Date: 06/11/2018 CLINICAL DATA:  Chronic LATERAL RIGHT ankle pain. No known injuries. EXAM: RIGHT ANKLE - COMPLETE 3+ VIEW COMPARISON:  None. FINDINGS: No evidence of acute fracture. Ankle mortise intact with well-preserved joint space. Well-preserved bone mineral density. No intrinsic osseous abnormalities. No visible joint effusion. IMPRESSION: Normal examination. Electronically Signed   By: Debby Satterfield M.D.   On: 06/11/2018 11:37   DG Knee Complete 4 Views Left Result Date: 06/11/2018 CLINICAL DATA:  Chronic LATERAL LEFT knee pain.  No known injuries. EXAM: LEFT KNEE - COMPLETE 4+ VIEW COMPARISON:  None. FINDINGS: No evidence of acute, subacute or healed fractures. Well-preserved joint spaces. Well-preserved bone mineral density. No intrinsic osseous abnormalities. No evidence of a joint effusion. Small enthesopathic spur at the insertion of the quadriceps tendon on the SUPERIOR patella. IMPRESSION: No significant osseous abnormality. Electronically Signed   By: Debby Satterfield M.D.   On: 06/11/2018 11:37   The 10-year ASCVD risk score (Arnett DK, et al., 2019) is: 2.7%   Values used to calculate the score:     Age: 86 years     Clincally relevant sex: Male     Is Non-Hispanic African American: Yes     Diabetic: No     Tobacco smoker: No     Systolic Blood Pressure: 114 mmHg     Is BP treated: No     HDL Cholesterol: 54.2 mg/dL     Total Cholesterol: 153 mg/dL   Assessment & Plan:  Encounter for general adult medical examination with abnormal findings -     Lipid panel; Future -     PSA; Future -     Hepatitis B surface antibody,quantitative; Future  Bradycardia -     TSH; Future -     Hepatic function panel; Future -     CBC with Differential/Platelet; Future -     Basic metabolic panel with GFR; Future -     EKG  12-Lead     Follow-up: Return in about 6 months (around 03/15/2025).  Debby Molt, MD

## 2024-09-15 NOTE — Patient Instructions (Signed)
 Bradycardia, Adult Bradycardia is a slower-than-normal heartbeat. A normal resting heart rate for an adult ranges from 60 to 100 beats per minute. With bradycardia, the resting heart rate is less than 60 beats per minute. Bradycardia can prevent enough oxygen  from reaching certain areas of your body when you are active. It can be serious if it keeps enough oxygen  from reaching your brain and other parts of your body. Bradycardia is not a problem for everyone. For some healthy adults, a slow resting heart rate is normal. What are the causes? This condition may be caused by: A problem with the heart, including: A problem with the heart's electrical system, such as a heart block. With a heart block, electrical signals between the chambers of the heart are partially or completely blocked, so they are not able to work as they should. A problem with the heart's natural pacemaker (sinus node). Heart disease. A heart attack. Heart damage. Lyme disease. A heart infection. A heart condition that is present at birth (congenital heart defect). Certain medicines that treat heart conditions. Certain conditions, such as hypothyroidism and obstructive sleep apnea. Problems with the balance of chemicals and other substances, like potassium, in the blood. Trauma. Radiation therapy. What increases the risk? You are more likely to develop this condition if you: Are age 30 or older. Have high blood pressure (hypertension), high cholesterol (hyperlipidemia), or diabetes. Drink heavily, use tobacco or nicotine products, or use drugs. What are the signs or symptoms? Symptoms of this condition include: Light-headedness. Feeling faint or fainting. Fatigue and weakness. Trouble with activity or exercise. Shortness of breath. Chest pain (angina). Drowsiness. Confusion. Dizziness. How is this diagnosed? This condition may be diagnosed based on: Your symptoms. Your medical history. A physical exam. During  the exam, your health care provider will listen to your heartbeat and check your pulse. To confirm the diagnosis, your health care provider may order tests, such as: Blood tests. An electrocardiogram (ECG). This test records the heart's electrical activity. The test can show how fast your heart is beating and whether the heartbeat is steady. A test in which you wear a portable device (event recorder or Holter monitor) to record your heart's electrical activity while you go about your day. An exercise test. How is this treated? Treatment for this condition depends on the cause of the condition and how severe your symptoms are. Treatment may involve: Treatment of the underlying condition. Changing your medicines or how much medicine you take. Having a small, battery-operated device called a pacemaker implanted under the skin. When bradycardia occurs, this device can be used to increase your heart rate and help your heart beat in a regular rhythm. Follow these instructions at home: Lifestyle Manage any health conditions that contribute to bradycardia as told by your health care provider. Follow a heart-healthy diet. A nutrition specialist (dietitian) can help educate you about healthy food options and changes. Follow an exercise program that is approved by your health care provider. Maintain a healthy weight. Try to reduce or manage your stress, such as with yoga or meditation. If you need help reducing stress, ask your health care provider. Do not use any products that contain nicotine or tobacco. These products include cigarettes, chewing tobacco, and vaping devices, such as e-cigarettes. If you need help quitting, ask your health care provider. Do not use illegal drugs. Alcohol  use If you drink alcohol : Limit how much you have to: 0-1 drink a day for women who are not pregnant. 0-2 drinks a day  for men. Know how much alcohol  is in a drink. In the [place] one drink equals one 12 oz bottle of  beer (355 mL), one 5 oz glass of wine (148 mL), or one 1 oz glass of hard liquor (44 mL). General instructions Take over-the-counter and prescription medicines only as told by your health care provider. Keep all follow-up visits. This is important. How is this prevented? In some cases, bradycardia may be prevented by: Treating underlying medical problems. Stopping behaviors or medicines that can trigger the condition. Contact a health care provider if: You feel light-headed or dizzy. You almost faint. You feel weak or are easily fatigued during physical activity. You experience confusion or have memory problems. Get help right away if: You faint. You have chest pains or an irregular heartbeat (palpitations). You have trouble breathing. These symptoms may represent a serious problem that is an emergency. Do not wait to see if the symptoms will go away. Get medical help right away. Call your local emergency services (911 in the [place] Do not drive yourself to the hospital. Summary Bradycardia is a slower-than-normal heartbeat. With bradycardia, the resting heart rate is less than 60 beats per minute. Treatment for this condition depends on the cause. Manage any health conditions that contribute to bradycardia as told by your health care provider. Do not use any products that contain nicotine or tobacco. These products include cigarettes, chewing tobacco, and vaping devices, such as e-cigarettes. Keep all follow-up visits. This is important. This information is not intended to replace advice given to you by your health care provider. Make sure you discuss any questions you have with your health care provider. Document Revised: 02/04/2021 Document Reviewed: 02/04/2021 Elsevier Patient Education  2024 [employer].

## 2024-09-16 LAB — HEPATITIS B SURFACE ANTIBODY, QUANTITATIVE: Hep B S AB Quant (Post): >1000 m[IU]/mL (ref >=10)
